# Patient Record
Sex: Female | Born: 1970 | Race: Black or African American | Hispanic: No | Marital: Single | Smoking: Former smoker
Health system: Southern US, Community
[De-identification: ages and names within clinical notes are randomized; demographics above are authoritative.]

## PROBLEM LIST (undated history)

## (undated) DIAGNOSIS — I10 Essential (primary) hypertension: Secondary | ICD-10-CM

## (undated) HISTORY — PX: HERNIA REPAIR: SHX51

---

## 1997-06-05 ENCOUNTER — Emergency Department (HOSPITAL_COMMUNITY): Admission: EM | Admit: 1997-06-05 | Discharge: 1997-06-05 | Payer: Self-pay | Admitting: Emergency Medicine

## 1998-06-16 ENCOUNTER — Emergency Department (HOSPITAL_COMMUNITY): Admission: EM | Admit: 1998-06-16 | Discharge: 1998-06-16 | Payer: Self-pay | Admitting: Emergency Medicine

## 2000-12-07 ENCOUNTER — Emergency Department (HOSPITAL_COMMUNITY): Admission: EM | Admit: 2000-12-07 | Discharge: 2000-12-07 | Payer: Self-pay | Admitting: Emergency Medicine

## 2002-10-30 ENCOUNTER — Emergency Department (HOSPITAL_COMMUNITY): Admission: EM | Admit: 2002-10-30 | Discharge: 2002-10-30 | Payer: Self-pay | Admitting: Emergency Medicine

## 2005-01-09 ENCOUNTER — Emergency Department (HOSPITAL_COMMUNITY): Admission: EM | Admit: 2005-01-09 | Discharge: 2005-01-09 | Payer: Self-pay | Admitting: Emergency Medicine

## 2005-03-01 ENCOUNTER — Emergency Department (HOSPITAL_COMMUNITY): Admission: EM | Admit: 2005-03-01 | Discharge: 2005-03-01 | Payer: Self-pay | Admitting: Emergency Medicine

## 2005-04-10 ENCOUNTER — Emergency Department (HOSPITAL_COMMUNITY): Admission: EM | Admit: 2005-04-10 | Discharge: 2005-04-10 | Payer: Self-pay | Admitting: Emergency Medicine

## 2006-01-10 ENCOUNTER — Ambulatory Visit: Payer: Self-pay | Admitting: Internal Medicine

## 2006-01-10 DIAGNOSIS — M1990 Unspecified osteoarthritis, unspecified site: Secondary | ICD-10-CM | POA: Insufficient documentation

## 2006-01-10 DIAGNOSIS — E785 Hyperlipidemia, unspecified: Secondary | ICD-10-CM | POA: Insufficient documentation

## 2006-01-10 LAB — CONVERTED CEMR LAB
Cholesterol: 211 mg/dL
HDL: 63 mg/dL
LDL Cholesterol: 131 mg/dL
Triglycerides: 84 mg/dL

## 2006-02-01 ENCOUNTER — Ambulatory Visit: Payer: Self-pay | Admitting: *Deleted

## 2006-02-06 ENCOUNTER — Emergency Department (HOSPITAL_COMMUNITY): Admission: EM | Admit: 2006-02-06 | Discharge: 2006-02-06 | Payer: Self-pay | Admitting: Emergency Medicine

## 2006-02-07 ENCOUNTER — Ambulatory Visit: Payer: Self-pay | Admitting: Internal Medicine

## 2006-02-07 DIAGNOSIS — K2900 Acute gastritis without bleeding: Secondary | ICD-10-CM | POA: Insufficient documentation

## 2006-04-10 ENCOUNTER — Ambulatory Visit: Payer: Self-pay | Admitting: Internal Medicine

## 2006-10-14 ENCOUNTER — Encounter (INDEPENDENT_AMBULATORY_CARE_PROVIDER_SITE_OTHER): Payer: Self-pay | Admitting: Internal Medicine

## 2006-11-22 ENCOUNTER — Encounter (INDEPENDENT_AMBULATORY_CARE_PROVIDER_SITE_OTHER): Payer: Self-pay | Admitting: *Deleted

## 2008-05-01 ENCOUNTER — Emergency Department (HOSPITAL_COMMUNITY): Admission: EM | Admit: 2008-05-01 | Discharge: 2008-05-01 | Payer: Self-pay | Admitting: Emergency Medicine

## 2009-03-21 ENCOUNTER — Emergency Department (HOSPITAL_COMMUNITY): Admission: EM | Admit: 2009-03-21 | Discharge: 2009-03-21 | Payer: Self-pay | Admitting: Emergency Medicine

## 2010-06-22 LAB — URINALYSIS, ROUTINE W REFLEX MICROSCOPIC
Bilirubin Urine: NEGATIVE
Glucose, UA: NEGATIVE mg/dL
Hgb urine dipstick: NEGATIVE
Ketones, ur: NEGATIVE mg/dL
Nitrite: NEGATIVE
Protein, ur: NEGATIVE mg/dL
Specific Gravity, Urine: 1.018 (ref 1.005–1.030)
Urobilinogen, UA: 0.2 mg/dL (ref 0.0–1.0)
pH: 6 (ref 5.0–8.0)

## 2010-06-22 LAB — POCT PREGNANCY, URINE: Preg Test, Ur: NEGATIVE

## 2013-01-14 ENCOUNTER — Ambulatory Visit (HOSPITAL_COMMUNITY)
Admission: RE | Admit: 2013-01-14 | Discharge: 2013-01-14 | Disposition: A | Discharge status: Home / Self Care | Payer: Self-pay | Source: Ambulatory Visit | Attending: Internal Medicine | Admitting: Internal Medicine

## 2013-01-14 ENCOUNTER — Other Ambulatory Visit (HOSPITAL_COMMUNITY): Payer: Self-pay | Admitting: Internal Medicine

## 2013-01-14 DIAGNOSIS — K921 Melena: Secondary | ICD-10-CM | POA: Insufficient documentation

## 2013-01-14 DIAGNOSIS — R109 Unspecified abdominal pain: Secondary | ICD-10-CM | POA: Insufficient documentation

## 2013-01-14 DIAGNOSIS — R52 Pain, unspecified: Secondary | ICD-10-CM

## 2013-12-30 ENCOUNTER — Emergency Department (HOSPITAL_COMMUNITY)
Admission: EM | Admit: 2013-12-30 | Discharge: 2013-12-30 | Disposition: A | Discharge status: Home / Self Care | Payer: No Typology Code available for payment source | Attending: Emergency Medicine | Admitting: Emergency Medicine

## 2013-12-30 ENCOUNTER — Emergency Department (HOSPITAL_COMMUNITY): Payer: No Typology Code available for payment source

## 2013-12-30 ENCOUNTER — Encounter (HOSPITAL_COMMUNITY): Payer: Self-pay | Admitting: Emergency Medicine

## 2013-12-30 DIAGNOSIS — Z79899 Other long term (current) drug therapy: Secondary | ICD-10-CM | POA: Insufficient documentation

## 2013-12-30 DIAGNOSIS — I10 Essential (primary) hypertension: Secondary | ICD-10-CM | POA: Insufficient documentation

## 2013-12-30 DIAGNOSIS — Z720 Tobacco use: Secondary | ICD-10-CM | POA: Insufficient documentation

## 2013-12-30 DIAGNOSIS — M19079 Primary osteoarthritis, unspecified ankle and foot: Secondary | ICD-10-CM

## 2013-12-30 DIAGNOSIS — T1490XA Injury, unspecified, initial encounter: Secondary | ICD-10-CM

## 2013-12-30 DIAGNOSIS — M1990 Unspecified osteoarthritis, unspecified site: Secondary | ICD-10-CM | POA: Insufficient documentation

## 2013-12-30 HISTORY — DX: Essential (primary) hypertension: I10

## 2013-12-30 MED ORDER — MELOXICAM 7.5 MG PO TABS: 7.5000 mg | ORAL_TABLET | Freq: Every day | ORAL | Status: DC

## 2013-12-30 NOTE — ED Provider Notes (Signed)
CSN: [id number]     Arrival date & time 12/30/13  1216 History  This chart was scribed for non-physician practitioner Trixie DredgeEmily Loris Seelye, PA-C working with Doug SouSam Jacubowitz, MD by Leone PayorSonum Patel, ED Scribe. This patient was seen in room TR04C/TR04C and the patient's care was started at 3:57 PM.    Chief Complaint  Patient presents with  . Toe Pain    The history is provided by the patient. No language interpreter was used.    HPI Comments: Patricia Zuniga is a 43 y.o. female who presents to the Emergency Department complaining of 1 year of daily left great toe pain that began after an injury. She states she struck the left great toe on a step and has had pain since then. She states the pain has worsened over the last 2 months. She describes the pain as aching and rates it as 9/10 currently. She has applied ice, received massages without relief. She states the pain is worse with ambulation. She denies weakness, numbness, tingling, fevers.    Past Medical History  Diagnosis Date  . Hypertension    Past Surgical History  Procedure Laterality Date  . Hernia repair     History reviewed. No pertinent family history. History  Substance Use Topics  . Smoking status: Current Every Day Smoker  . Smokeless tobacco: Not on file  . Alcohol Use: No   OB History   Grav Para Term Preterm Abortions TAB SAB Ect Mult Living                 Review of Systems  Musculoskeletal: Positive for arthralgias.  Skin: Negative for wound.  Neurological: Negative for weakness and numbness.      Allergies  Review of patient's allergies indicates no known allergies.  Home Medications   Prior to Admission medications   Medication Sig Start Date End Date Taking? Authorizing Provider  losartan (COZAAR) 25 MG tablet Take 25 mg by mouth daily.   Yes Historical Provider, MD   BP 163/81  Pulse 75  Temp(Src) 98.3 F (36.8 C) (Oral)  Resp 18  Ht 5\' 2"  (1.575 m)  Wt 160 lb (72.576 kg)  BMI 29.26 kg/m2  SpO2 99%   LMP 12/07/2013 Physical Exam  Nursing note and vitals reviewed. Constitutional: She appears well-developed and well-nourished. No distress.  HENT:  Head: Normocephalic and atraumatic.  Neck: Neck supple.  Pulmonary/Chest: Effort normal.  Musculoskeletal: Normal range of motion. She exhibits no edema and no tenderness.  Right foot nontender. No erythema, edema, warmth, discharge, or skin changes. Left foot normal.    Neurological: She is alert.  Skin: She is not diaphoretic.    ED Course  Procedures (including critical care time)  DIAGNOSTIC STUDIES: Oxygen Saturation is 99% on RA, normal by my interpretation.    COORDINATION OF CARE: 4:03 PM Discussed treatment plan with pt at bedside and pt agreed to plan.   Labs Review Labs Reviewed - No data to display  Imaging Review Dg Toe Great Right  12/30/2013   CLINICAL DATA:  Toe injury  EXAM: RIGHT GREAT TOE  COMPARISON:  03/01/2005  FINDINGS: Progressive degenerative change and spurring in the first metatarsophalangeal joint. Normal alignment. No fracture identified.  IMPRESSION: Progressive degenerative change in the first metatarsal phalangeal joint. Negative for fracture.   Electronically Signed   By: Marlan Palauharles  Clark M.D.   On: 12/30/2013 15:55     EKG Interpretation None      MDM   Final diagnoses:  Arthritis of  first MTP joint    Afebrile, nontoxic patient with right 1st MTP pain x 1 year.  Remote injury, kicking concrete stair.  No clinical e/o septic joint or gout.  Xray demonstrates arthritic changes.   D/C home with mobic, podiatry follow up as needed.   Discussed result, findings, treatment, and follow up  with patient.  Pt given return precautions.  Pt verbalizes understanding and agrees with plan.       I personally performed the services described in this documentation, which was scribed in my presence. The recorded information has been reviewed and is accurate.   Trixie Dredgemily Ryenne Lynam, PA-C 12/30/13 [phone]

## 2013-12-30 NOTE — ED Notes (Signed)
Declined W/C at D/C and was escorted to lobby by RN. 

## 2013-12-30 NOTE — Discharge Instructions (Signed)
Read the information below.  Use the prescribed medication as directed.  Please discuss all new medications with your pharmacist.  You may return to the Emergency Department at any time for worsening condition or any new symptoms that concern you.  If you develop uncontrolled pain, weakness or numbness of the extremity, severe discoloration of the skin, or you are unable to move your toe or walk, return to the ER for a recheck.      Arthritis, Nonspecific Arthritis is inflammation of a joint. This usually means pain, redness, warmth or swelling are present. One or more joints may be involved. There are a number of types of arthritis. Your caregiver may not be able to tell what type of arthritis you have right away. CAUSES  The most common cause of arthritis is the wear and tear on the joint (osteoarthritis). This causes damage to the cartilage, which can break down over time. The knees, hips, back and neck are most often affected by this type of arthritis. Other types of arthritis and common causes of joint pain include:  Sprains and other injuries near the joint. Sometimes minor sprains and injuries cause pain and swelling that develop hours later.  Rheumatoid arthritis. This affects hands, feet and knees. It usually affects both sides of your body at the same time. It is often associated with chronic ailments, fever, weight loss and general weakness.  Crystal arthritis. Gout and pseudo gout can cause occasional acute severe pain, redness and swelling in the foot, ankle, or knee.  Infectious arthritis. Bacteria can get into a joint through a break in overlying skin. This can cause infection of the joint. Bacteria and viruses can also spread through the blood and affect your joints.  Drug, infectious and allergy reactions. Sometimes joints can become mildly painful and slightly swollen with these types of illnesses. SYMPTOMS   Pain is the main symptom.  Your joint or joints can also be red,  swollen and warm or hot to the touch.  You may have a fever with certain types of arthritis, or even feel overall ill.  The joint with arthritis will hurt with movement. Stiffness is present with some types of arthritis. DIAGNOSIS  Your caregiver will suspect arthritis based on your description of your symptoms and on your exam. Testing may be needed to find the type of arthritis:  Blood and sometimes urine tests.  X-ray tests and sometimes CT or MRI scans.  Removal of fluid from the joint (arthrocentesis) is done to check for bacteria, crystals or other causes. Your caregiver (or a specialist) will numb the area over the joint with a local anesthetic, and use a needle to remove joint fluid for examination. This procedure is only minimally uncomfortable.  Even with these tests, your caregiver may not be able to tell what kind of arthritis you have. Consultation with a specialist (rheumatologist) may be helpful. TREATMENT  Your caregiver will discuss with you treatment specific to your type of arthritis. If the specific type cannot be determined, then the following general recommendations may apply. Treatment of severe joint pain includes:  Rest.  Elevation.  Anti-inflammatory medication (for example, ibuprofen) may be prescribed. Avoiding activities that cause increased pain.  Only take over-the-counter or prescription medicines for pain and discomfort as recommended by your caregiver.  Cold packs over an inflamed joint may be used for 10 to 15 minutes every hour. Hot packs sometimes feel better, but do not use overnight. Do not use hot packs if you are  diabetic without your caregiver's permission.  A cortisone shot into arthritic joints may help reduce pain and swelling.  Any acute arthritis that gets worse over the next 1 to 2 days needs to be looked at to be sure there is no joint infection. Long-term arthritis treatment involves modifying activities and lifestyle to reduce joint  stress jarring. This can include weight loss. Also, exercise is needed to nourish the joint cartilage and remove waste. This helps keep the muscles around the joint strong. HOME CARE INSTRUCTIONS   Do not take aspirin to relieve pain if gout is suspected. This elevates uric acid levels.  Only take over-the-counter or prescription medicines for pain, discomfort or fever as directed by your caregiver.  Rest the joint as much as possible.  If your joint is swollen, keep it elevated.  Use crutches if the painful joint is in your leg.  Drinking plenty of fluids may help for certain types of arthritis.  Follow your caregiver's dietary instructions.  Try low-impact exercise such as:  Swimming.  Water aerobics.  Biking.  Walking.  Morning stiffness is often relieved by a warm shower.  Put your joints through regular range-of-motion. SEEK MEDICAL CARE IF:   You do not feel better in 24 hours or are getting worse.  You have side effects to medications, or are not getting better with treatment. SEEK IMMEDIATE MEDICAL CARE IF:   You have a fever.  You develop severe joint pain, swelling or redness.  Many joints are involved and become painful and swollen.  There is severe back pain and/or leg weakness.  You have loss of bowel or bladder control. Document Released: 03/31/2004 Document Revised: 05/16/2011 Document Reviewed: 04/16/2008 [facility] Patient Information 2015 [place] [place]. This information is not intended to replace advice given to you by your health care provider. Make sure you discuss any questions you have with your health care provider.

## 2013-12-30 NOTE — ED Provider Notes (Signed)
Medical screening examination/treatment/procedure(s) were performed by non-physician practitioner and as supervising physician I was immediately available for consultation/collaboration.   EKG Interpretation None       Doug SouSam Joshoa Shawler, MD 12/30/13 1723

## 2013-12-30 NOTE — ED Notes (Signed)
Per pt sts she injured her right big toe about 1 year ago and has been having issues with it since.,

## 2014-10-30 ENCOUNTER — Encounter (HOSPITAL_COMMUNITY): Payer: Self-pay | Admitting: *Deleted

## 2014-10-30 ENCOUNTER — Emergency Department (INDEPENDENT_AMBULATORY_CARE_PROVIDER_SITE_OTHER)
Admission: EM | Admit: 2014-10-30 | Discharge: 2014-10-30 | Disposition: A | Discharge status: Home / Self Care | Payer: Self-pay | Source: Home / Self Care | Attending: Family Medicine | Admitting: Family Medicine

## 2014-10-30 DIAGNOSIS — K0889 Other specified disorders of teeth and supporting structures: Secondary | ICD-10-CM

## 2014-10-30 DIAGNOSIS — K088 Other specified disorders of teeth and supporting structures: Secondary | ICD-10-CM

## 2014-10-30 MED ORDER — CLINDAMYCIN HCL 300 MG PO CAPS: 300.0000 mg | ORAL_CAPSULE | Freq: Three times a day (TID) | ORAL | Status: DC

## 2014-10-30 MED ORDER — DICLOFENAC POTASSIUM 50 MG PO TABS: 50.0000 mg | ORAL_TABLET | Freq: Three times a day (TID) | ORAL | Status: DC

## 2014-10-30 NOTE — ED Notes (Signed)
Pt  Reports  l  Lower  Toothache      With  Some  Swelling       Pt    Reports  The  Symptoms  X  3  Days

## 2014-10-30 NOTE — Discharge Instructions (Signed)
Take medicine as prescribed, see your dentist as soon as possible °

## 2014-10-30 NOTE — ED Provider Notes (Signed)
CSN: [id number]     Arrival date & time 10/30/14  1620 History   First MD Initiated Contact with Patient 10/30/14 1636     Chief Complaint  Patient presents with  . Dental Pain   (Consider location/radiation/quality/duration/timing/severity/associated sxs/prior Treatment) Patient is a 44 y.o. female presenting with tooth pain. The history is provided by the patient.  Dental Pain Location:  Upper and lower Upper teeth location:  15/LU 2nd molar Lower teeth location:  17/LL 3rd molar Quality:  Throbbing Severity:  Moderate Onset quality:  Gradual Duration:  3 days Progression:  Unchanged Chronicity:  New Context: dental caries and poor dentition     Past Medical History  Diagnosis Date  . Hypertension    Past Surgical History  Procedure Laterality Date  . Hernia repair     History reviewed. No pertinent family history. Social History  Substance Use Topics  . Smoking status: Current Every Day Smoker  . Smokeless tobacco: None  . Alcohol Use: No   OB History    No data available     Review of Systems  Constitutional: Negative.   HENT: Positive for dental problem.     Allergies  Review of patient's allergies indicates no known allergies.  Home Medications   Prior to Admission medications   Medication Sig Start Date End Date Taking? Authorizing Provider  clindamycin (CLEOCIN) 300 MG capsule Take 1 capsule (300 mg total) by mouth 3 (three) times daily. 10/30/14   Linna Hoff, MD  diclofenac (CATAFLAM) 50 MG tablet Take 1 tablet (50 mg total) by mouth 3 (three) times daily. 10/30/14   Linna Hoff, MD  losartan (COZAAR) 25 MG tablet Take 25 mg by mouth daily.    Historical Provider, MD  meloxicam (MOBIC) 7.5 MG tablet Take 1 tablet (7.5 mg total) by mouth daily. 12/30/13   Trixie Dredge, PA-C   Meds Ordered and Administered this Visit  Medications - No data to display  BP 124/89 mmHg  Pulse 81  Temp(Src) 98.2 F (36.8 C) (Oral)  Resp 16  SpO2 98%  LMP  10/08/2014 No data found.   Physical Exam  Constitutional: She appears well-developed and well-nourished. No distress.  HENT:  Mouth/Throat: Uvula is midline and oropharynx is clear and moist. Abnormal dentition. Dental abscesses and dental caries present.    Nursing note and vitals reviewed.   ED Course  Procedures (including critical care time)  Labs Review Labs Reviewed - No data to display  Imaging Review No results found.   Visual Acuity Review  Right Eye Distance:   Left Eye Distance:   Bilateral Distance:    Right Eye Near:   Left Eye Near:    Bilateral Near:         MDM   1. Pain, dental        Linna Hoff, MD 10/30/14 [phone]

## 2014-11-05 ENCOUNTER — Emergency Department (INDEPENDENT_AMBULATORY_CARE_PROVIDER_SITE_OTHER)
Admission: EM | Admit: 2014-11-05 | Discharge: 2014-11-05 | Disposition: A | Discharge status: Home / Self Care | Payer: Self-pay | Source: Home / Self Care

## 2014-11-05 ENCOUNTER — Encounter (HOSPITAL_COMMUNITY): Payer: Self-pay | Admitting: Emergency Medicine

## 2014-11-05 DIAGNOSIS — B373 Candidiasis of vulva and vagina: Secondary | ICD-10-CM

## 2014-11-05 DIAGNOSIS — B3731 Acute candidiasis of vulva and vagina: Secondary | ICD-10-CM

## 2014-11-05 MED ORDER — FLUCONAZOLE 150 MG PO TABS: 150.0000 mg | ORAL_TABLET | Freq: Every day | ORAL | Status: DC

## 2014-11-05 NOTE — ED Provider Notes (Signed)
CSN: [id number]     Arrival date & time 11/05/14  1322 History   None    Chief Complaint  Patient presents with  . Vaginitis   (Consider location/radiation/quality/duration/timing/severity/associated sxs/prior Treatment) HPI  Patient seen on 07/30/2014 for dental infection. Started on clindamycin. Significantly improved since starting clindamycin. Swelling still present but never resolved. Patient states that over the last 2-3 days she has developed white thick vaginal discharge. No foul odor. Associated with vaginal itching. Denies any lower abdominal pain, back pain, dysuria, frequency, fevers, nausea, vomiting, constipation, diarrhea. Problem is intermittent and getting worse. She is not tried anything for her symptoms.  Past Medical History  Diagnosis Date  . Hypertension    Past Surgical History  Procedure Laterality Date  . Hernia repair     History reviewed. No pertinent family history. Social History  Substance Use Topics  . Smoking status: Current Every Day Smoker  . Smokeless tobacco: None  . Alcohol Use: No   OB History    No data available     Review of Systems Per HPI with all other pertinent systems negative.   Allergies  Review of patient's allergies indicates no known allergies.  Home Medications   Prior to Admission medications   Medication Sig Start Date End Date Taking? Authorizing Provider  clindamycin (CLEOCIN) 300 MG capsule Take 1 capsule (300 mg total) by mouth 3 (three) times daily. 10/30/14  Yes Linna Hoff, MD  diclofenac (CATAFLAM) 50 MG tablet Take 1 tablet (50 mg total) by mouth 3 (three) times daily. 10/30/14  Yes Linna Hoff, MD  losartan (COZAAR) 25 MG tablet Take 25 mg by mouth daily.   Yes Historical Provider, MD  meloxicam (MOBIC) 7.5 MG tablet Take 1 tablet (7.5 mg total) by mouth daily. 12/30/13  Yes Trixie Dredge, PA-C  fluconazole (DIFLUCAN) 150 MG tablet Take 1 tablet (150 mg total) by mouth daily. Repeat dose in 3 days 11/05/14    Ozella Rocks, MD   Meds Ordered and Administered this Visit  Medications - No data to display  BP 147/92 mmHg  Pulse 68  Temp(Src) 97.9 F (36.6 C) (Oral)  Resp 18  SpO2 95%  LMP 11/05/2014 (Exact Date) No data found.   Physical Exam Physical Exam  Constitutional: oriented to person, place, and time. appears well-developed and well-nourished. No distress.  HENT:  Head: Normocephalic and atraumatic.  Eyes: EOMI. PERRL.  Neck: Normal range of motion.  Cardiovascular: RRR, no m/r/g, 2+ distal pulses,  Pulmonary/Chest: Effort normal and breath sounds normal. No respiratory distress.  Abdominal: Soft. Bowel sounds are normal. NonTTP, no distension.  Musculoskeletal: Normal range of motion. Non ttp, no effusion.  Neurological: alert and oriented to person, place, and time.  Skin: Skin is warm. No rash noted. non diaphoretic.  Psychiatric: normal mood and affect. behavior is normal. Judgment and thought content normal.   ED Course  Procedures (including critical care time)  Labs Review Labs Reviewed - No data to display  Imaging Review No results found.   Visual Acuity Review  Right Eye Distance:   Left Eye Distance:   Bilateral Distance:    Right Eye Near:   Left Eye Near:    Bilateral Near:         MDM   1. Yeast vaginitis    Continue clindamycin for improving dental infection. Start Diflucan. Follow-up when necessary    Ozella Rocks, MD 11/05/14 [phone]

## 2014-11-05 NOTE — ED Notes (Signed)
Pt was prescribed Clindamycin last Thursday for a toothache.  Pt now reports yeast infection symptoms to include, discharge, burning and itching.

## 2014-11-05 NOTE — Discharge Instructions (Signed)
You have a yeast infection.  Please take the diflucan Please continue your antibiotic

## 2015-11-02 ENCOUNTER — Encounter (HOSPITAL_COMMUNITY): Payer: Self-pay | Admitting: Emergency Medicine

## 2015-11-02 ENCOUNTER — Emergency Department (HOSPITAL_COMMUNITY)
Admission: EM | Admit: 2015-11-02 | Discharge: 2015-11-02 | Disposition: A | Discharge status: Home / Self Care | Payer: Self-pay | Attending: Emergency Medicine | Admitting: Emergency Medicine

## 2015-11-02 DIAGNOSIS — I10 Essential (primary) hypertension: Secondary | ICD-10-CM | POA: Insufficient documentation

## 2015-11-02 DIAGNOSIS — Z87891 Personal history of nicotine dependence: Secondary | ICD-10-CM | POA: Insufficient documentation

## 2015-11-02 DIAGNOSIS — Z79899 Other long term (current) drug therapy: Secondary | ICD-10-CM | POA: Insufficient documentation

## 2015-11-02 LAB — RAPID URINE DRUG SCREEN, HOSP PERFORMED
Amphetamines: NOT DETECTED
Barbiturates: NOT DETECTED
Benzodiazepines: NOT DETECTED
Cocaine: NOT DETECTED
Opiates: NOT DETECTED
Tetrahydrocannabinol: POSITIVE — AB

## 2015-11-02 LAB — COMPREHENSIVE METABOLIC PANEL
ALT: 25 U/L (ref 14–54)
AST: 27 U/L (ref 15–41)
Albumin: 3.7 g/dL (ref 3.5–5.0)
Alkaline Phosphatase: 52 U/L (ref 38–126)
Anion gap: 4 — ABNORMAL LOW (ref 5–15)
BUN: 8 mg/dL (ref 6–20)
CALCIUM: 9.3 mg/dL (ref 8.9–10.3)
CO2: 29 mmol/L (ref 22–32)
Chloride: 105 mmol/L (ref 101–111)
Creatinine, Ser: 0.68 mg/dL (ref 0.44–1.00)
GFR calc Af Amer: >60 mL/min (ref >60)
GFR calc non Af Amer: >60 mL/min (ref >60)
Glucose, Bld: 108 mg/dL — ABNORMAL HIGH (ref 65–99)
Potassium: 4 mmol/L (ref 3.5–5.1)
Sodium: 138 mmol/L (ref 135–145)
Total Bilirubin: 0.5 mg/dL (ref 0.3–1.2)
Total Protein: 6.7 g/dL (ref 6.5–8.1)

## 2015-11-02 LAB — CBC WITH DIFFERENTIAL/PLATELET
BASOS PCT: 0 %
Basophils Absolute: 0 10*3/uL (ref 0.0–0.1)
Eosinophils Absolute: 0.1 10*3/uL (ref 0.0–0.7)
Eosinophils Relative: 1 %
HCT: 37 % (ref 36.0–46.0)
Hemoglobin: 12.2 g/dL (ref 12.0–15.0)
Lymphocytes Relative: 45 %
Lymphs Abs: 4.6 10*3/uL — ABNORMAL HIGH (ref 0.7–4.0)
MCH: 29.6 pg (ref 26.0–34.0)
MCHC: 33 g/dL (ref 30.0–36.0)
MCV: 89.8 fL (ref 78.0–100.0)
MONOS PCT: 6 %
Monocytes Absolute: 0.6 10*3/uL (ref 0.1–1.0)
Neutro Abs: 4.8 10*3/uL (ref 1.7–7.7)
Neutrophils Relative %: 48 %
Platelets: 384 10*3/uL (ref 150–400)
RBC: 4.12 MIL/uL (ref 3.87–5.11)
RDW: 15.1 % (ref 11.5–15.5)
WBC: 10.2 10*3/uL (ref 4.0–10.5)

## 2015-11-02 MED ORDER — CLONIDINE HCL 0.1 MG PO TABS: 0.1000 mg | ORAL_TABLET | Freq: Every day | ORAL | 0 refills | Status: DC

## 2015-11-02 NOTE — ED Notes (Signed)
Patient ambulated to bathroom without difficulty, placed back on heart monitor

## 2015-11-02 NOTE — ED Triage Notes (Signed)
Went to pharmacy -- had BP checked-- pharmacist told pt to come and get it checked. Recently was prescribed BP med (a new one) .

## 2015-11-02 NOTE — ED Provider Notes (Signed)
[facility]-EMERGENCY DEPT Provider Note   CSN: [id number] Arrival date & time: 11/02/15  1117     History   Chief Complaint Chief Complaint  Patient presents with  . Hypertension    HPI Patricia Zuniga is a 45 y.o. female.  HPI Patient comes in with high blood pressure.  Taking 2 different medications.  Went to the dentist today and they told her they could not do anything because her blood pressure was elevated.  She did smoke a cigarette earlier today.  Patient denies chest pain or headache.  Denies nausea vomiting. Past Medical History:  Diagnosis Date  . Hypertension     Patient Active Problem List   Diagnosis Date Noted  . GASTRITIS, ACUTE, WITHOUT HEMORRHAGE 02/07/2006  . HYPERCHOLESTEROLEMIA, BORDERLINE 01/10/2006  . DEGENERATIVE JOINT DISEASE 01/10/2006    Past Surgical History:  Procedure Laterality Date  . HERNIA REPAIR      OB History    No data available       Home Medications    Prior to Admission medications   Medication Sig Start Date End Date Taking? Authorizing Provider  hydrochlorothiazide (HYDRODIURIL) 25 MG tablet Take 25 mg by mouth daily.   Yes Historical Provider, MD  losartan (COZAAR) 50 MG tablet Take 50 mg by mouth daily.   Yes Historical Provider, MD  clindamycin (CLEOCIN) 300 MG capsule Take 1 capsule (300 mg total) by mouth 3 (three) times daily. Patient not taking: Reported on 11/02/2015 10/30/14   Linna Hoff, MD  cloNIDine (CATAPRES) 0.1 MG tablet Take 1 tablet (0.1 mg total) by mouth daily. 11/02/15   Nelva Nay, MD  diclofenac (CATAFLAM) 50 MG tablet Take 1 tablet (50 mg total) by mouth 3 (three) times daily. Patient not taking: Reported on 11/02/2015 10/30/14   Linna Hoff, MD  fluconazole (DIFLUCAN) 150 MG tablet Take 1 tablet (150 mg total) by mouth daily. Repeat dose in 3 days Patient not taking: Reported on 11/02/2015 11/05/14   Ozella Rocks, MD  meloxicam (MOBIC) 7.5 MG tablet Take 1 tablet (7.5 mg total) by mouth  daily. Patient not taking: Reported on 11/02/2015 12/30/13   Trixie Dredge, PA-C    Family History No family history on file.  Social History Social History  Substance Use Topics  . Smoking status: Current Every Day Smoker    Packs/day: 0.50  . Smokeless tobacco: Never Used  . Alcohol use No     Allergies   Review of patient's allergies indicates no known allergies.   Review of Systems Review of Systems  All other systems reviewed and are negative.    Physical Exam Updated Vital Signs BP 167/86   Pulse (!) 52   Temp 97.9 F (36.6 C) (Oral)   Resp 16   Ht 5\' 2"  (1.575 m)   Wt 170 lb (77.1 kg)   LMP 10/01/2015   SpO2 94%   BMI 31.09 kg/m   Physical Exam Physical Exam  Nursing note and vitals reviewed. Constitutional: She is oriented to person, place, and time. She appears well-developed and well-nourished. No distress.  HENT:  Head: Normocephalic and atraumatic.  Eyes: Pupils are equal, round, and reactive to light.  Neck: Normal range of motion.  Cardiovascular: Normal rate and intact distal pulses.   Pulmonary/Chest: No respiratory distress.  Abdominal: Normal appearance. She exhibits no distension.  Musculoskeletal: Normal range of motion.  Neurological: She is alert and oriented to person, place, and time. No cranial nerve deficit.  Skin: Skin is warm  and dry. No rash noted.  Psychiatric: She has a normal mood and affect. Her behavior is normal.    ED Treatments / Results  Labs (all labs ordered are listed, but only abnormal results are displayed) Labs Reviewed  CBC WITH DIFFERENTIAL/PLATELET - Abnormal; Notable for the following:       Result Value   Lymphs Abs 4.6 (*)    All other components within normal limits  COMPREHENSIVE METABOLIC PANEL - Abnormal; Notable for the following:    Glucose, Bld 108 (*)    Anion gap 4 (*)    All other components within normal limits  URINE RAPID DRUG SCREEN, HOSP PERFORMED - Abnormal; Notable for the following:     Tetrahydrocannabinol POSITIVE (*)    All other components within normal limits    EKG  EKG Interpretation None       Radiology No results found.  Procedures Procedures (including critical care time)  Medications Ordered in ED Medications - No data to display   Initial Impression / Assessment and Plan / ED Course  I have reviewed the triage vital signs and the nursing notes.  Pertinent labs & imaging results that were available during my care of the patient were reviewed by me and considered in my medical decision making (see chart for details).  Clinical Course  Patient on maximum dose of losartan.  Will add clonidine at a low dose of 0.1 mg daily and have her follow-up with her primary care physician since possible.    Final Clinical Impressions(s) / ED Diagnoses   Final diagnoses:  Essential hypertension    New Prescriptions New Prescriptions   CLONIDINE (CATAPRES) 0.1 MG TABLET    Take 1 tablet (0.1 mg total) by mouth daily.     Nelva Nayobert Marcheta Horsey, MD 11/02/15 1359

## 2018-05-31 MED FILL — SIMVASTATIN 20 MG TABLET: 20 | 30 days supply | Qty: 30 | Fill #0

## 2018-06-29 MED FILL — HYDROCHLOROTHIAZIDE 25 MG T: 25 | 30 days supply | Qty: 30 | Fill #0

## 2018-06-29 MED FILL — LOSARTAN POTASSIUM 50 MG TA: 50 | 30 days supply | Qty: 30 | Fill #0

## 2018-06-29 MED FILL — SIMVASTATIN 20 MG TABLET: 20 | 30 days supply | Qty: 30 | Fill #1

## 2018-08-06 MED FILL — LOSARTAN POTASSIUM 50 MG TA: 50 | 30 days supply | Qty: 30 | Fill #1

## 2018-08-06 MED FILL — HYDROCHLOROTHIAZIDE 25 MG T: 25 | 30 days supply | Qty: 30 | Fill #1

## 2018-08-06 MED FILL — SIMVASTATIN 20 MG TABLET: 20 | 30 days supply | Qty: 30 | Fill #2

## 2019-08-09 ENCOUNTER — Encounter (HOSPITAL_COMMUNITY): Payer: Self-pay | Admitting: Emergency Medicine

## 2019-08-09 ENCOUNTER — Ambulatory Visit (HOSPITAL_COMMUNITY)
Admission: EM | Admit: 2019-08-09 | Discharge: 2019-08-09 | Disposition: A | Discharge status: Home / Self Care | Payer: BLUE CROSS/BLUE SHIELD | Attending: Family Medicine | Admitting: Family Medicine

## 2019-08-09 ENCOUNTER — Other Ambulatory Visit: Payer: Self-pay

## 2019-08-09 DIAGNOSIS — M6530 Trigger finger, unspecified finger: Secondary | ICD-10-CM | POA: Diagnosis not present

## 2019-08-09 DIAGNOSIS — M7702 Medial epicondylitis, left elbow: Secondary | ICD-10-CM

## 2019-08-09 MED ORDER — METHYLPREDNISOLONE 4 MG PO TBPK: ORAL_TABLET | ORAL | 0 refills | Status: DC

## 2019-08-09 NOTE — Discharge Instructions (Signed)
Take the Medrol Dosepak as directed. Take all of day 1 today. This is the anti-inflammatory medicine to take down pain and swelling Rest your arm. Wear sling and splint to remind you to limit your activity. Use ice for 20 minutes every 2-4 hours Call hand specialist if not improving by Monday

## 2019-08-09 NOTE — ED Triage Notes (Signed)
Pt c/o chronic left hand pain, specifically left middle finger is unable to straighten or make a fist. She states she is left handed.

## 2019-08-09 NOTE — ED Provider Notes (Signed)
[place]    CSN: [id number] Arrival date & time: 08/09/19  1528      History   Chief Complaint Chief Complaint  Patient presents with  . Hand Pain    HPI Patricia Zuniga is a 49 y.o. female.   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  Some pain up in her shoulder. No fall or injury.  No change in activity.  She has not taken any medication for this pain. She is compliant with her blood pressure medication.  Past Medical History:  Diagnosis Date  . Hypertension     Patient Active Problem List   Diagnosis Date Noted  . GASTRITIS, ACUTE, WITHOUT HEMORRHAGE 02/07/2006  . HYPERCHOLESTEROLEMIA, BORDERLINE 01/10/2006  . DEGENERATIVE JOINT DISEASE 01/10/2006    Past Surgical History:  Procedure Laterality Date  . HERNIA REPAIR      OB History   No obstetric history on file.      Home Medications    Prior to Admission medications   Medication Sig Start Date End Date Taking? Authorizing Provider  cloNIDine (CATAPRES) 0.1 MG tablet Take 1 tablet (0.1 mg total) by mouth daily. 11/02/15  Yes Leonard Schwartz, MD  hydrochlorothiazide (HYDRODIURIL) 25 MG tablet Take 25 mg by mouth daily.   Yes [provider]  losartan (COZAAR) 50 MG tablet Take 50 mg by mouth daily.   Yes [provider]  methylPREDNISolone (MEDROL DOSEPAK) 4 MG TBPK tablet tad 08/09/19   Raylene Everts, MD    Family  History History reviewed. No pertinent family history.  Social History Social History   Tobacco Use  . Smoking status: Current Every Day Smoker    Packs/day: 0.50  . Smokeless tobacco: Never Used  Substance Use Topics  . Alcohol use: No  . Drug use: No     Allergies   Patient has no known allergies.   Review of Systems Review of Systems  Musculoskeletal: Positive for arthralgias.       Pain in left hand and arm     Physical Exam Triage Vital Signs ED Triage Vitals  Enc Vitals Group     BP 08/09/19 1636 (!) 161/88     Pulse Rate 08/09/19 1636 76     Resp 08/09/19 1636 18     Temp 08/09/19 1636 99.1 F (37.3 C)     Temp Source 08/09/19 1636 Oral     SpO2 08/09/19 1636 100 %     Weight --      Height --      Head Circumference --      Peak Flow --      Pain Score 08/09/19 1633 10     Pain Loc --      Pain Edu? --      Excl. in [place]? --    No data found.  Updated  Vital Signs BP (!) 161/88 (BP Location: Right Arm)   Pulse 76   Temp 99.1 F (37.3 C) (Oral)   Resp 18   LMP 07/26/2019   SpO2 100%   Visual Acuity Right Eye Distance:   Left Eye Distance:   Bilateral Distance:    Right Eye Near:   Left Eye Near:    Bilateral Near:     Physical Exam Constitutional:      General: She is not in acute distress.    Appearance: She is well-developed and normal weight.     Comments: Appears uncomfortable.  Resists movement of left arm  HENT:     Head: Normocephalic and atraumatic.     Mouth/Throat:     Comments: Mask is in place Eyes:     Conjunctiva/sclera: Conjunctivae normal.     Pupils: Pupils are equal, round, and reactive to light.  Cardiovascular:     Rate and Rhythm: Normal rate.  Pulmonary:     Effort: Pulmonary effort is normal. No respiratory distress.  Musculoskeletal:        General: Normal range of motion.     Cervical back: Normal range of motion.     Comments: Patient has slow but full range of motion of her neck.  Mild tenderness in  the left upper body of the trapezius.  No tenderness of the shoulder joint or movement of the shoulder.  There is tenderness of the lateral epicondyles and pain with wrist extension.  There is tenderness over the palmar aspect of the flexor tendon pulley, long finger, left hand.  There is clicking with range of motion that is painful for patient.  Sensory exam is normal  Skin:    General: Skin is warm and dry.  Neurological:     Mental Status: She is alert.  Psychiatric:        Mood and Affect: Mood normal.        Behavior: Behavior normal.      UC Treatments / Results  Labs (all labs ordered are listed, but only abnormal results are displayed) Labs Reviewed - No data to display  EKG   Radiology No results found.  Procedures Procedures (including critical care time)  Medications Ordered in UC Medications - No data to display  Initial Impression / Assessment and Plan / UC Course  I have reviewed the triage vital signs and the nursing notes.  Pertinent labs & imaging results that were available during my care of the patient were reviewed by me and considered in my medical decision making (see chart for details).     Patient has a trigger finger.  She also has some tendinitis of the forearm at the elbow.  Good range of motion of the elbow.  Some tenderness in the upper trapezius of the left.  Holds left arm is bothering her, likely due to modified body mechanics from change in the use of her arm and hand Final Clinical Impressions(s) / UC Diagnoses   Final diagnoses:  Trigger finger, acquired  Medial epicondylitis of elbow, left     Discharge Instructions     Take the Medrol Dosepak as directed. Take all of day 1 today. This is the anti-inflammatory medicine to take down pain and swelling Rest your arm. Wear sling and splint to remind you to limit your activity. Use ice for 20 minutes every 2-4 hours Call hand specialist if not improving by Monday    ED  Prescriptions    Medication Sig Dispense Auth.  Provider   methylPREDNISolone (MEDROL DOSEPAK) 4 MG TBPK tablet tad 21 tablet Eustace Moore, MD     PDMP not reviewed this encounter.   Eustace Moore, MD 08/09/19 1740

## 2019-11-05 ENCOUNTER — Emergency Department (HOSPITAL_COMMUNITY): Payer: BLUE CROSS/BLUE SHIELD

## 2019-11-05 ENCOUNTER — Encounter (HOSPITAL_COMMUNITY): Payer: Self-pay

## 2019-11-05 ENCOUNTER — Emergency Department (HOSPITAL_COMMUNITY)
Admission: EM | Admit: 2019-11-05 | Discharge: 2019-11-05 | Disposition: A | Discharge status: Home / Self Care | Payer: BLUE CROSS/BLUE SHIELD | Attending: Emergency Medicine | Admitting: Emergency Medicine

## 2019-11-05 ENCOUNTER — Other Ambulatory Visit: Payer: Self-pay

## 2019-11-05 DIAGNOSIS — Z87891 Personal history of nicotine dependence: Secondary | ICD-10-CM | POA: Insufficient documentation

## 2019-11-05 DIAGNOSIS — I10 Essential (primary) hypertension: Secondary | ICD-10-CM | POA: Insufficient documentation

## 2019-11-05 DIAGNOSIS — M79645 Pain in left finger(s): Secondary | ICD-10-CM | POA: Diagnosis present

## 2019-11-05 DIAGNOSIS — Z79899 Other long term (current) drug therapy: Secondary | ICD-10-CM | POA: Insufficient documentation

## 2019-11-05 NOTE — Discharge Instructions (Addendum)
I want you to follow-up with hand, call Dr. Amanda Pea when you get out of here to make an appointment. I want you to take Aleve once in the morning and once at night for the next 10 days as we discussed. If you have any new or worsening concerning symptoms please come back to the emergency department. Also until you know that your blood pressure was elevated today, speak to your primary care about this. Use the list below to find a primary care doctor, you can also go to [facility] health community health and wellness.

## 2019-11-05 NOTE — ED Triage Notes (Signed)
Patient c/o left middle finger pain that radiates into her left arm x 2 weeks. Patient states increased pain with movement and is now having decreased movement in her left middle finger.

## 2019-11-05 NOTE — ED Provider Notes (Signed)
[facility]-EMERGENCY DEPT Provider Note   CSN: [id number] Arrival date & time: 11/05/19  1318     History Chief Complaint  Patient presents with  . Hand Pain    Patricia Zuniga is a 49 y.o. female with pertinent past medical history of hypertension that presents the emergency department today for left middle finger pain for the past 2 weeks.  Denies any trauma to the area.  States that she is also having pain in her shoulder, for a long time  No known injury that she is aware of.  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  States that the most thing that is bothering her is her middle finger, worse when she moves it.  Has not tried anything for this.  Denies any fevers, chills.  Admits to some subjective paresthesias in her middle finger at times.  Denies any chest pain, shortness of breath.  No cardiac history. No other complaints. No SOB.   HPI     Past Medical History:  Diagnosis Date  . Hypertension     Patient Active Problem List   Diagnosis Date Noted  . GASTRITIS, ACUTE, WITHOUT HEMORRHAGE 02/07/2006  . HYPERCHOLESTEROLEMIA, BORDERLINE 01/10/2006  . DEGENERATIVE JOINT DISEASE 01/10/2006    Past Surgical History:  Procedure Laterality Date  . HERNIA REPAIR       OB History   No obstetric history on file.     Family History  Problem Relation Age of Onset  . Heart failure Mother   . Hypertension Father     Social History   Tobacco Use  . Smoking status: Former Smoker    Packs/day: 0.50  . Smokeless tobacco: Never Used  Vaping Use  . Vaping Use: Never used  Substance Use Topics  . Alcohol use: No  . Drug use: No    Home Medications Prior to Admission medications   Medication Sig Start Date End Date Taking? Authorizing Provider  cloNIDine (CATAPRES) 0.1 MG tablet Take 1 tablet (0.1 mg total) by mouth daily. 11/02/15   Nelva Nay, MD    hydrochlorothiazide (HYDRODIURIL) 25 MG tablet Take 25 mg by mouth daily.    [provider]  losartan (COZAAR) 50 MG tablet Take 50 mg by mouth daily.    [provider]  methylPREDNISolone (MEDROL DOSEPAK) 4 MG TBPK tablet tad 08/09/19   Eustace Moore, MD    Allergies    Patient has no known allergies.  Review of Systems   Review of Systems  Constitutional: Negative for diaphoresis, fatigue and fever.  Eyes: Negative for visual disturbance.  Respiratory: Negative for shortness of breath.   Cardiovascular: Negative for chest pain.  Gastrointestinal: Negative for nausea and vomiting.  Musculoskeletal: Positive for arthralgias. Negative for back pain and myalgias.  Skin: Negative for color change, pallor, rash and wound.  Neurological: Negative for syncope, weakness, light-headedness, numbness and headaches.  Psychiatric/Behavioral: Negative for behavioral problems and confusion.    Physical Exam Updated Vital Signs BP (!) 156/94 (BP Location: Right Arm)   Pulse 92   Temp 98.9 F (37.2 C) (Oral)   Resp 18   Ht 5\' 2"  (1.575 m)   Wt 79.4 kg   LMP 09/07/2019   SpO2 96%   BMI 32.01 kg/m   Physical Exam Constitutional:      General: She is not in acute distress.    Appearance: Normal appearance. She  is not ill-appearing, toxic-appearing or diaphoretic.  HENT:     Head: Normocephalic and atraumatic.  Eyes:     Extraocular Movements: Extraocular movements intact.     Pupils: Pupils are equal, round, and reactive to light.  Cardiovascular:     Rate and Rhythm: Normal rate and regular rhythm.     Pulses: Normal pulses.  Pulmonary:     Effort: Pulmonary effort is normal.     Breath sounds: Normal breath sounds.  Musculoskeletal:        General: Normal range of motion.     Cervical back: Normal range of motion and neck supple. No rigidity or tenderness.     Comments: Middle finer pain.  Painful movements upon flexion.  However patient is able to fully  flex and extend finger also able to abduct and adduct finger.  Is able to make an ok sign.  Normal range of motion to all joints on middle finger.  Normal sensation to sharp and dull sensation of middle finger and throughout hand.  Normal cap refill.  Radial pulse 2+ and equal.    Nontender throughout rest of arm.  Slightly tender around shoulder.  Normal range of motion to the shoulder.  Normal strength.  Normal sensation.  Right arm normal  Skin:    General: Skin is warm and dry.     Capillary Refill: Capillary refill takes less than 2 seconds.  Neurological:     General: No focal deficit present.     Mental Status: She is alert and oriented to person, place, and time.     Cranial Nerves: No cranial nerve deficit.     Sensory: No sensory deficit.     Motor: No weakness.  Psychiatric:        Mood and Affect: Mood normal.        Behavior: Behavior normal.        Thought Content: Thought content normal.     ED Results / Procedures / Treatments   Labs (all labs ordered are listed, but only abnormal results are displayed) Labs Reviewed - No data to display  EKG None  Radiology DG Shoulder Left  Result Date: 11/05/2019 CLINICAL DATA:  Shoulder pain EXAM: LEFT SHOULDER - 2+ VIEW COMPARISON:  None. FINDINGS: There is no evidence of fracture or dislocation. Mild irregularity along the under surface of the humeral head is likely degenerative in nature. Soft tissues are unremarkable. IMPRESSION: Mild degenerative changes without acute findings. Electronically Signed   By: Romona Curls M.D.   On: 11/05/2019 14:42   DG Finger Middle Left  Result Date: 11/05/2019 CLINICAL DATA:  Middle finger pain for 2 weeks. EXAM: LEFT MIDDLE FINGER 2+V COMPARISON:  None. FINDINGS: There is no evidence of fracture or dislocation. There is no evidence of arthropathy or other focal bone abnormality. Soft tissues are unremarkable. IMPRESSION: Negative. Electronically Signed   By: Romona Curls M.D.   On:  11/05/2019 14:39    Procedures Procedures (including critical care time)  Medications Ordered in ED Medications - No data to display  ED Course  I have reviewed the triage vital signs and the nursing notes.  Pertinent labs & imaging results that were available during my care of the patient were reviewed by me and considered in my medical decision making (see chart for details).    MDM Rules/Calculators/A&P                         Tashara V  Bonifield is a 49 y.o. female with pertinent past medical history of hypertension that presents the emergency department today for left middle finger pain for the past 2 weeks. Do not think that patient is having atypical ACS, has been going on for 2 weeks and patient has most likely overuse injury.  Finger x-ray and shoulder x-ray negative.  Think that patient most likely has flexor tendon injury, physical exam benign.  Patient has normal sensation to middle finger and throughout hand and arm.  Is able to range shoulder and elbow and fingers in all directions. Normal sensation.  Is painful to move finger.  Patient follow-up with hand and take Aleve.  Symptomatic treatment discussed with patient.  Strict return precautions given.  Patient to be discharged. Doubt need for further emergent work up at this time. I explained the diagnosis and have given explicit precautions to return to the ER including for any other new or worsening symptoms. The patient understands and accepts the medical plan as it's been dictated and I have answered their questions. Discharge instructions concerning home care and prescriptions have been given. The patient is STABLE and is discharged to home in good condition.  I discussed this case with my attending physician who cosigned this note including patient's presenting symptoms, physical exam, and planned diagnostics and interventions. Attending physician stated agreement with plan or made changes to plan which were implemented.       Final Clinical Impression(s) / ED Diagnoses Final diagnoses:  Finger pain, left    Rx / DC Orders ED Discharge Orders    None       Farrel Gordon, PA-C 11/05/19 1515    Lorre Nick, MD 11/06/19 1217

## 2019-12-06 ENCOUNTER — Other Ambulatory Visit (HOSPITAL_COMMUNITY): Payer: Self-pay | Admitting: Family Medicine

## 2020-02-18 ENCOUNTER — Encounter: Payer: Self-pay | Admitting: Family Medicine

## 2020-02-18 ENCOUNTER — Other Ambulatory Visit: Payer: Self-pay

## 2020-02-18 ENCOUNTER — Ambulatory Visit (INDEPENDENT_AMBULATORY_CARE_PROVIDER_SITE_OTHER): Payer: BLUE CROSS/BLUE SHIELD | Admitting: Family Medicine

## 2020-02-18 VITALS — BP 126/70 | HR 78 | Ht 62.0 in | Wt 178.2 lb

## 2020-02-18 DIAGNOSIS — Z13220 Encounter for screening for lipoid disorders: Secondary | ICD-10-CM

## 2020-02-18 DIAGNOSIS — M79645 Pain in left finger(s): Secondary | ICD-10-CM

## 2020-02-18 DIAGNOSIS — Z131 Encounter for screening for diabetes mellitus: Secondary | ICD-10-CM

## 2020-02-18 DIAGNOSIS — Z716 Tobacco abuse counseling: Secondary | ICD-10-CM

## 2020-02-18 DIAGNOSIS — Z23 Encounter for immunization: Secondary | ICD-10-CM | POA: Diagnosis not present

## 2020-02-18 DIAGNOSIS — Z1211 Encounter for screening for malignant neoplasm of colon: Secondary | ICD-10-CM | POA: Diagnosis not present

## 2020-02-18 LAB — POCT GLYCOSYLATED HEMOGLOBIN (HGB A1C): Hemoglobin A1C: 5.7 % — AB (ref 4.0–5.6)

## 2020-02-18 NOTE — Patient Instructions (Signed)
Thank you for coming to see me today. It was a pleasure. We spoke about stopping smoking this will reduce the risk of heart disease and stroke. You can follow up with me in a few weeks.  We will get some labs today.  If they are abnormal or we need to do something about them, I will call you.  If they are normal, I will send you a message on MyChart (if it is active) or a letter in the mail.  If you don't hear from [place] in 2 weeks, please call the office at the number below.  You may have tenditinitis on your finger. Please try tylenol and ibuprofen at home. Rest the finger and use compression.  If you have any questions or concerns, please do not hesitate to call the office at [phone].  If you develop fevers>100.5, shortness of breath, chest pain, palpitations, dizziness, abdominal pain, nausea, vomiting, diarrhea or cannot eat or drink then please go to the ER immediately.  Best wishes,   Dr Allena Katz

## 2020-02-18 NOTE — Progress Notes (Addendum)
° ° ° °  SUBJECTIVE:   CHIEF COMPLAINT / HPI:   Patricia Zuniga is a 49 y.o. 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 3rd finger pain started 1 month ago. Denies clicking, catching sensation or locking of finger. Denies Used a compression gloves which helps a little. Has not taken analgesia for it. She was seen in urgent care and was told she has tendinitis.    PMH HTN, hemorrhoids  PSH Hernia repair 2014, foot surgery 2017  [facility]  NKDA HCTZ, Losartan  FH Mom died 2-heart attack age 86 Dad-died 01/30/1998- stroke age 68  Sister-died 31-Oct-2022, Aneurysm  SH  Smokes half a pack a day, smoking for 2.5 years. Stopped for a year previously restarted because of stress. Works out in [employer] regularly and is trying to keep fit. Denies illicit drug use or ETOH use. Works in Surveyor, mining at rehab facility. Lives with boyfriend and feels safe with him.   HM Covid vaccine-up to date  Flu vaccine-up to date Pap smear-due Mammogram-due Colonoscopy-due, patient is happy for me to place referral for screening colonoscopy.  OBJECTIVE:   BP 126/70    Pulse 78    Ht 5\' 2"  (1.575 m)    Wt 178 lb 4 oz (80.9 kg)    LMP 01/09/2020    SpO2 98%    BMI 32.60 kg/m    General: Alert, no acute distress, pleasant Cardio: Normal S1 and S2, RRR, no r/m/g Pulm: CTAB, normal work of breathing Abdomen: Bowel sounds normal. Abdomen soft and non-tender.  MSK: no obvious erythema or skin changes over palmar or dorsal aspect of hand or digits. No obvious flexion deformities. No obvious palpable tendon nodes. Tenderness on palpation of left 3rd digit. Tenderness on flexion and extension of left 3rd digit. Normal flexion and extension of all digits bilaterally. Normal sensation.  Extremities: No peripheral edema.  Neuro: Cranial nerves grossly intact   ASSESSMENT/PLAN:   Screening for cholesterol  level Obtained lipid panel today.  Encounter for screening colonoscopy Placed referral to GI for colonoscopy.   Encounter for smoking cessation counseling Educated and discussed smoking cessation to reduced CVD and stroke risk especially given that patient's parents died very young of these conditions. Patient was previously successful with smoking cessation and was able to go "cold 13/06/2019" and gave up for 1 year. Offered nicotine patch/ referral to smoking cessation clinic. Pt declined and would rather try quitting herself. Will follow up at next visit.  Finger pain, left Likely tendinitis/repetitive strain from occupation. Recommended tylenol, ibuprofen, ice and compression. Also considered trigger finger given site of pain but unlikely as no nodes palpated and lacks typical exam findings such as locking, flexion deformities etc. Follow up if persistent symptoms.   Diabetes mellitus screening A1c 5.7 showing prediabetes. Will inform patient of results.     [place], MD PGY-2 [facility]

## 2020-02-19 DIAGNOSIS — M79645 Pain in left finger(s): Secondary | ICD-10-CM | POA: Insufficient documentation

## 2020-02-19 DIAGNOSIS — Z1211 Encounter for screening for malignant neoplasm of colon: Secondary | ICD-10-CM | POA: Insufficient documentation

## 2020-02-19 DIAGNOSIS — Z13220 Encounter for screening for lipoid disorders: Secondary | ICD-10-CM | POA: Insufficient documentation

## 2020-02-19 DIAGNOSIS — Z131 Encounter for screening for diabetes mellitus: Secondary | ICD-10-CM | POA: Insufficient documentation

## 2020-02-19 DIAGNOSIS — Z716 Tobacco abuse counseling: Secondary | ICD-10-CM | POA: Insufficient documentation

## 2020-02-19 LAB — BASIC METABOLIC PANEL
BUN/Creatinine Ratio: 17 (ref 9–23)
BUN: 11 mg/dL (ref 6–24)
CO2: 25 mmol/L (ref 20–29)
Calcium: 9.7 mg/dL (ref 8.7–10.2)
Chloride: 98 mmol/L (ref 96–106)
Creatinine, Ser: 0.65 mg/dL (ref 0.57–1.00)
GFR calc Af Amer: 119 mL/min/{1.73_m2} (ref >59)
GFR calc non Af Amer: 103 mL/min/{1.73_m2} (ref >59)
Glucose: 83 mg/dL (ref 65–99)
Potassium: 3.6 mmol/L (ref 3.5–5.2)
Sodium: 136 mmol/L (ref 134–144)

## 2020-02-19 LAB — LIPID PANEL
Chol/HDL Ratio: 4.5 ratio — ABNORMAL HIGH (ref 0.0–4.4)
Cholesterol, Total: 257 mg/dL — ABNORMAL HIGH (ref 100–199)
HDL: 57 mg/dL (ref >39)
LDL Chol Calc (NIH): 176 mg/dL — ABNORMAL HIGH (ref 0–99)
Triglycerides: 132 mg/dL (ref 0–149)
VLDL Cholesterol Cal: 24 mg/dL (ref 5–40)

## 2020-02-19 NOTE — Assessment & Plan Note (Signed)
Likely tendinitis/repetitive strain from occupation. Recommended tylenol, ibuprofen, ice and compression. Also considered trigger finger given site of pain but unlikely as no nodes palpated and lacks typical exam findings such as locking, flexion deformities etc. Follow up if persistent symptoms.

## 2020-02-19 NOTE — Assessment & Plan Note (Addendum)
Obtained lipid panel today. 

## 2020-02-19 NOTE — Assessment & Plan Note (Addendum)
A1c 5.7 showing prediabetes. Will inform patient of results.

## 2020-02-19 NOTE — Assessment & Plan Note (Signed)
Educated and discussed smoking cessation to reduced CVD and stroke risk especially given that patient's parents died very young of these conditions. Patient was previously successful with smoking cessation and was able to go "cold [place]" and gave up for 1 year. Offered nicotine patch/ referral to smoking cessation clinic. Pt declined and would rather try quitting herself. Will follow up at next visit.

## 2020-02-19 NOTE — Assessment & Plan Note (Signed)
Placed referral to GI for colonoscopy.

## 2020-02-26 ENCOUNTER — Telehealth: Payer: Self-pay | Admitting: Family Medicine

## 2020-02-26 ENCOUNTER — Other Ambulatory Visit: Payer: Self-pay | Admitting: Family Medicine

## 2020-02-26 MED ORDER — SIMVASTATIN 40 MG PO TABS: 40.0000 mg | ORAL_TABLET | Freq: Every day | ORAL | 3 refills | Status: DC

## 2020-02-26 NOTE — Telephone Encounter (Signed)
Called patient to inform her of her lab results showing that she is prediabetic and that her cholesterol levels are elevated. ASCVD risk 23.6%. Patient has previously taken a simvastatin before and would like to take this again. Prescribed Simvastatin 40mg  and sent to pharmacy. Pt will follow up in 3 months for repeat A1c and lipid panel.  MD  PGY-2, Family Medicine

## 2020-03-03 ENCOUNTER — Other Ambulatory Visit: Payer: Self-pay | Admitting: Family Medicine

## 2020-06-29 ENCOUNTER — Other Ambulatory Visit (HOSPITAL_COMMUNITY): Payer: Self-pay

## 2020-06-29 MED FILL — Losartan Potassium Tab 50 MG: ORAL | 90 days supply | Qty: 90 | Fill #0 | Status: AC

## 2020-06-29 MED FILL — Hydrochlorothiazide Tab 25 MG: ORAL | 90 days supply | Qty: 90 | Fill #0 | Status: AC

## 2020-11-27 ENCOUNTER — Other Ambulatory Visit (HOSPITAL_COMMUNITY): Payer: Self-pay

## 2020-11-27 MED FILL — Hydrochlorothiazide Tab 25 MG: ORAL | 90 days supply | Qty: 90 | Fill #1 | Status: CN

## 2020-11-27 MED FILL — Losartan Potassium Tab 50 MG: ORAL | 90 days supply | Qty: 90 | Fill #1 | Status: CN

## 2020-12-05 ENCOUNTER — Other Ambulatory Visit (HOSPITAL_COMMUNITY): Payer: Self-pay

## 2020-12-28 ENCOUNTER — Other Ambulatory Visit (HOSPITAL_COMMUNITY): Payer: Self-pay

## 2020-12-28 MED FILL — Losartan Potassium Tab 50 MG: ORAL | 90 days supply | Qty: 90 | Fill #1 | Status: AC

## 2020-12-28 MED FILL — Hydrochlorothiazide Tab 25 MG: ORAL | 90 days supply | Qty: 90 | Fill #1 | Status: AC

## 2020-12-31 ENCOUNTER — Other Ambulatory Visit (HOSPITAL_COMMUNITY): Payer: Self-pay

## 2021-04-13 NOTE — Progress Notes (Signed)
° ° ° °  SUBJECTIVE:   CHIEF COMPLAINT / HPI:   Patricia Zuniga is a 51 y.o. female presents for HTN  Hypertension Patient's current antihypertensive medications include: HCTZ 25mg  and Cozaar 50mg . Compliant with medications and tolerating well without side effects.  Checking BP at work with readings [id number] systolic. Unable to remember diastolic readings.  Denies any SOB, CP, vision changes, LE edema, medication SEs, or symptoms of hypotension.   Most recent creatinine trend:  Lab Results  Component Value Date   CREATININE 0.71 04/14/2021   CREATININE 0.65 02/18/2020   CREATININE 0.68 11/02/2015     Patient has had a BMP in the past 1 year.  [place] Office Visit from 02/18/2020 in [place]  PHQ-9 Total Score 3       Right knee pain Pt reports right sided knee pain and swelling for 2 weeks. No falls/trauma to the area. It felt it was "stuck". Denies erythema, warmness to touch or fevers. 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 She walks for an hour a day.   PERTINENT  PMH / PSH: HTN, HLD   OBJECTIVE:   BP 130/78    Pulse 76    Ht 5\' 2"  (1.575 m)    Wt 185 lb 9.6 oz (84.2 kg)    LMP 04/06/2021    SpO2 98%    BMI 33.95 kg/m    General: Alert, no acute distress Cardio: Normal S1 and S2, RRR, no r/m/g Pulm: CTAB, normal work of breathing Abdomen: Bowel sounds normal. Abdomen soft and non-tender.  Extremities: No peripheral edema.  Neuro: Cranial nerves grossly intact   Right knee: - Inspection: no gross deformity. No swelling/effusion, erythema or bruising. Skin intact - Palpation: mild TTP anterior knee joint - ROM: full active ROM with flexion and extension in knee and hip - Strength: 5/5 strength - Neuro/vasc: NV intact - Special Tests: - LIGAMENTS: negative anterior and posterior drawer, negative Lachman's, no MCL or LCL laxity  -- MENISCUS: negative McMurray's, negative Thessaly   Minor tenderness over right knee joint    ASSESSMENT/PLAN:   HTN (hypertension) BP at goal today. Continue Cozaar and HCTZ. Refilled meds. Obtained BMP today.  Prediabetes A1c 5.6 today. Follow up in 3-6 months.  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 Mildly swollen on exam today. No concerns for septic arthritis given lack of fever, erythema, warm joint and restricted ROM. Pt also has some right hip pain. Ordered right knee and hip x-rays. Recommended conservative management for the pain ie tylenol, diclofenac gel etc. Also recommended pt reduces number of minutes walking as this may be worsening the pain. Follow up with me for results.   Screening for cholesterol level Normal cholesterol results from lipid panel. Continue simvastatin 40mg .    Lattie Haw, MD PGY-3 [place]

## 2021-04-14 ENCOUNTER — Other Ambulatory Visit: Payer: Self-pay

## 2021-04-14 ENCOUNTER — Encounter: Payer: Self-pay | Admitting: Family Medicine

## 2021-04-14 ENCOUNTER — Ambulatory Visit (INDEPENDENT_AMBULATORY_CARE_PROVIDER_SITE_OTHER): Payer: BLUE CROSS/BLUE SHIELD | Admitting: Family Medicine

## 2021-04-14 VITALS — BP 130/78 | HR 76 | Ht 62.0 in | Wt 185.6 lb

## 2021-04-14 DIAGNOSIS — I159 Secondary hypertension, unspecified: Secondary | ICD-10-CM

## 2021-04-14 DIAGNOSIS — I10 Essential (primary) hypertension: Secondary | ICD-10-CM

## 2021-04-14 DIAGNOSIS — M25561 Pain in right knee: Secondary | ICD-10-CM

## 2021-04-14 DIAGNOSIS — Z13220 Encounter for screening for lipoid disorders: Secondary | ICD-10-CM

## 2021-04-14 DIAGNOSIS — R7303 Prediabetes: Secondary | ICD-10-CM

## 2021-04-14 DIAGNOSIS — M25551 Pain in right hip: Secondary | ICD-10-CM

## 2021-04-14 DIAGNOSIS — Z131 Encounter for screening for diabetes mellitus: Secondary | ICD-10-CM

## 2021-04-14 LAB — POCT GLYCOSYLATED HEMOGLOBIN (HGB A1C): Hemoglobin A1C: 5.6 % (ref 4.0–5.6)

## 2021-04-14 MED ORDER — DICLOFENAC SODIUM 1 % EX GEL
4.0000 g | CUTANEOUS | 0 refills | Status: AC | PRN
Start: 2021-04-14 — End: 2021-05-14
  Filled 2021-04-14: qty 350, 30d supply, fill #0

## 2021-04-14 MED ORDER — HYDROCHLOROTHIAZIDE 25 MG PO TABS
ORAL_TABLET | Freq: Every day | ORAL | 2 refills | Status: DC
  Filled 2021-04-14: qty 90, 90d supply, fill #0
  Filled 2021-09-28: qty 90, 90d supply, fill #1

## 2021-04-14 MED ORDER — LOSARTAN POTASSIUM 50 MG PO TABS
ORAL_TABLET | Freq: Every day | ORAL | 2 refills | Status: DC
  Filled 2021-04-14: qty 90, 90d supply, fill #0
  Filled 2021-09-28: qty 90, 90d supply, fill #1

## 2021-04-14 NOTE — Patient Instructions (Signed)
Thank you for coming to see me today. It was a pleasure. Today we discussed your right hip and knee pain. It could be arthritis. It is unlikely to be an infection. I recommend tylenol, motrin, diclofenac gel and ice to the area. Compression helps too.   I have placed an order for right hip and right knee xray.  Please go to [facility] or at [facility] to have this completed.  You do not need an appointment, but if you would like to call them beforehand, their number is [phone].  We will contact you with your results afterwards.   Please follow-up with me in 2-3 weeks   If you have any questions or concerns, please do not hesitate to call the office at [phone].  Best wishes,   Dr Allena Katz

## 2021-04-15 ENCOUNTER — Encounter: Payer: Self-pay | Admitting: Family Medicine

## 2021-04-15 LAB — BASIC METABOLIC PANEL
BUN/Creatinine Ratio: 13 (ref 9–23)
BUN: 9 mg/dL (ref 6–24)
CO2: 24 mmol/L (ref 20–29)
Calcium: 9.7 mg/dL (ref 8.7–10.2)
Chloride: 105 mmol/L (ref 96–106)
Creatinine, Ser: 0.71 mg/dL (ref 0.57–1.00)
Glucose: 94 mg/dL (ref 70–99)
Potassium: 4.2 mmol/L (ref 3.5–5.2)
Sodium: 144 mmol/L (ref 134–144)
eGFR: 102 mL/min/{1.73_m2} (ref >59)

## 2021-04-15 LAB — LIPID PANEL
Chol/HDL Ratio: 2.4 ratio (ref 0.0–4.4)
Cholesterol, Total: 157 mg/dL (ref 100–199)
HDL: 65 mg/dL (ref >39)
LDL Chol Calc (NIH): 78 mg/dL (ref 0–99)
Triglycerides: 75 mg/dL (ref 0–149)
VLDL Cholesterol Cal: 14 mg/dL (ref 5–40)

## 2021-04-16 DIAGNOSIS — M25561 Pain in right knee: Secondary | ICD-10-CM | POA: Insufficient documentation

## 2021-04-16 DIAGNOSIS — I10 Essential (primary) hypertension: Secondary | ICD-10-CM | POA: Insufficient documentation

## 2021-04-16 DIAGNOSIS — R7303 Prediabetes: Secondary | ICD-10-CM | POA: Insufficient documentation

## 2021-04-16 NOTE — Assessment & Plan Note (Signed)
BP at goal today. Continue Cozaar and HCTZ. Refilled meds. Obtained BMP today.

## 2021-04-16 NOTE — Assessment & Plan Note (Signed)
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 Mildly swollen on exam today. No concerns for septic arthritis given lack of fever, erythema, warm joint and restricted ROM. Pt also has some right hip pain. Ordered right knee and hip x-rays. Recommended conservative management for the pain ie tylenol, diclofenac gel etc. Also recommended pt reduces number of minutes walking as this may be worsening the pain. Follow up with me for results.

## 2021-04-16 NOTE — Assessment & Plan Note (Signed)
A1c 5.6 today. Follow up in 3-6 months.

## 2021-04-16 NOTE — Assessment & Plan Note (Addendum)
Normal cholesterol results from lipid panel. Continue simvastatin 40mg .

## 2021-06-03 ENCOUNTER — Telehealth: Payer: Self-pay

## 2021-06-03 NOTE — Telephone Encounter (Signed)
Patient contacted to schedule mammogram.  ? ?RE: Mobile Mammo event located at: ? ?[facility]  ?      ?[address]    ?[place] [address]    ? ?Date: April 7th  ? ? ?LVM for pt to call back as soon as possible.  ? ?

## 2021-06-10 NOTE — Telephone Encounter (Signed)
Final attempt to contact ? ?

## 2021-09-28 ENCOUNTER — Other Ambulatory Visit: Payer: Self-pay | Admitting: Family Medicine

## 2021-09-28 ENCOUNTER — Other Ambulatory Visit (HOSPITAL_COMMUNITY): Payer: Self-pay

## 2021-09-28 MED ORDER — SIMVASTATIN 40 MG PO TABS
40.0000 mg | ORAL_TABLET | Freq: Every day | ORAL | 3 refills | Status: DC
  Filled 2021-09-28: qty 90, 90d supply, fill #0

## 2021-11-21 IMAGING — CR DG FINGER MIDDLE 2+V*L*
3 series · 3 of 3 positions shown · non-contrast
Comparison: None.

CLINICAL DATA: Middle finger pain for 2 weeks.

EXAM:
LEFT MIDDLE FINGER 2+V

[x finger pa left]
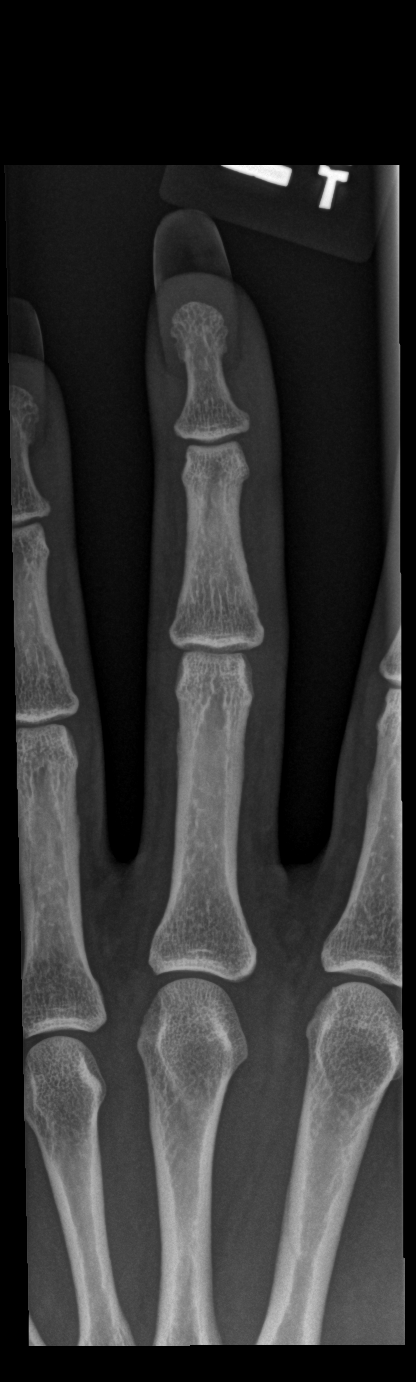

[x finger obl left]
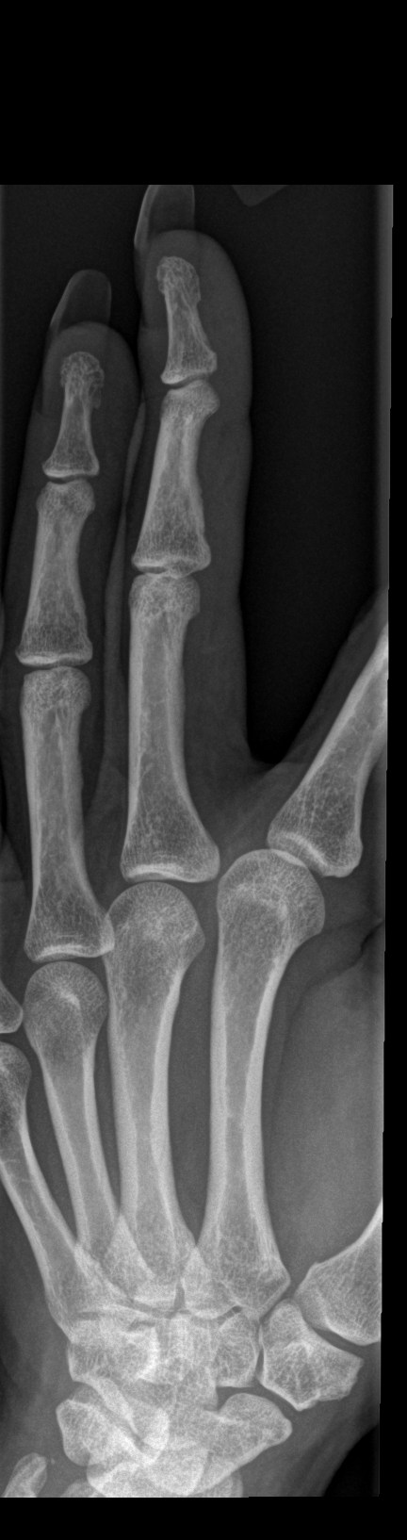

[x finger lat left]
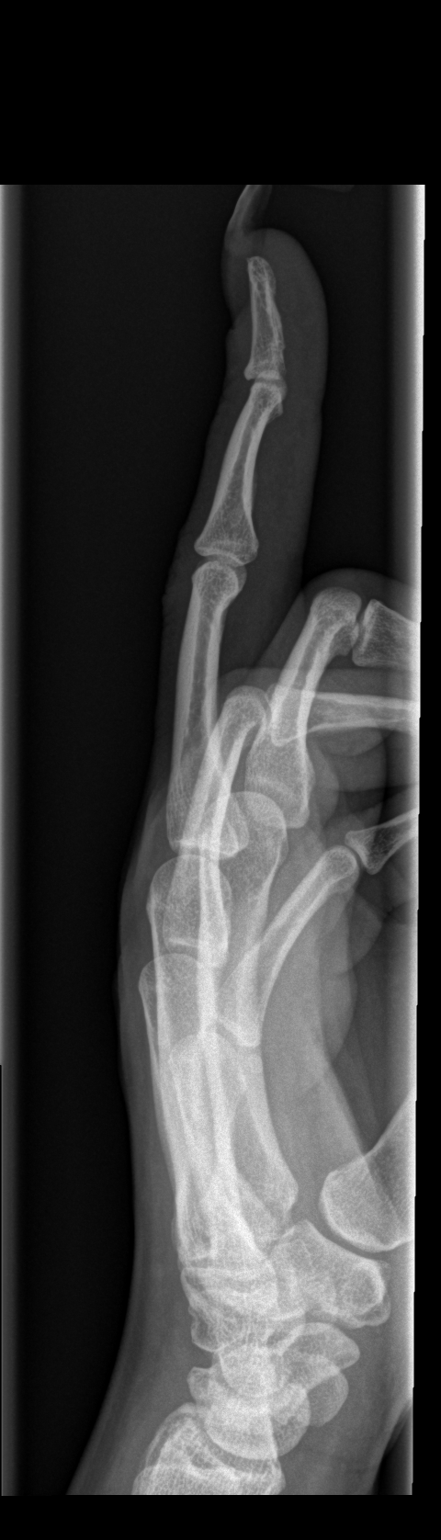

[3 of 3 positions shown; findings below may reference images not displayed]

FINDINGS: There is no evidence of fracture or dislocation. There is no
evidence of arthropathy or other focal bone abnormality. Soft
tissues are unremarkable.
IMPRESSION: Negative.

## 2021-11-21 IMAGING — CR DG SHOULDER 2+V*L*
3 series · 3 of 3 positions shown · non-contrast
Comparison: None.

CLINICAL DATA: Shoulder pain

EXAM:
LEFT SHOULDER - 2+ VIEW

[w shoulder external left]
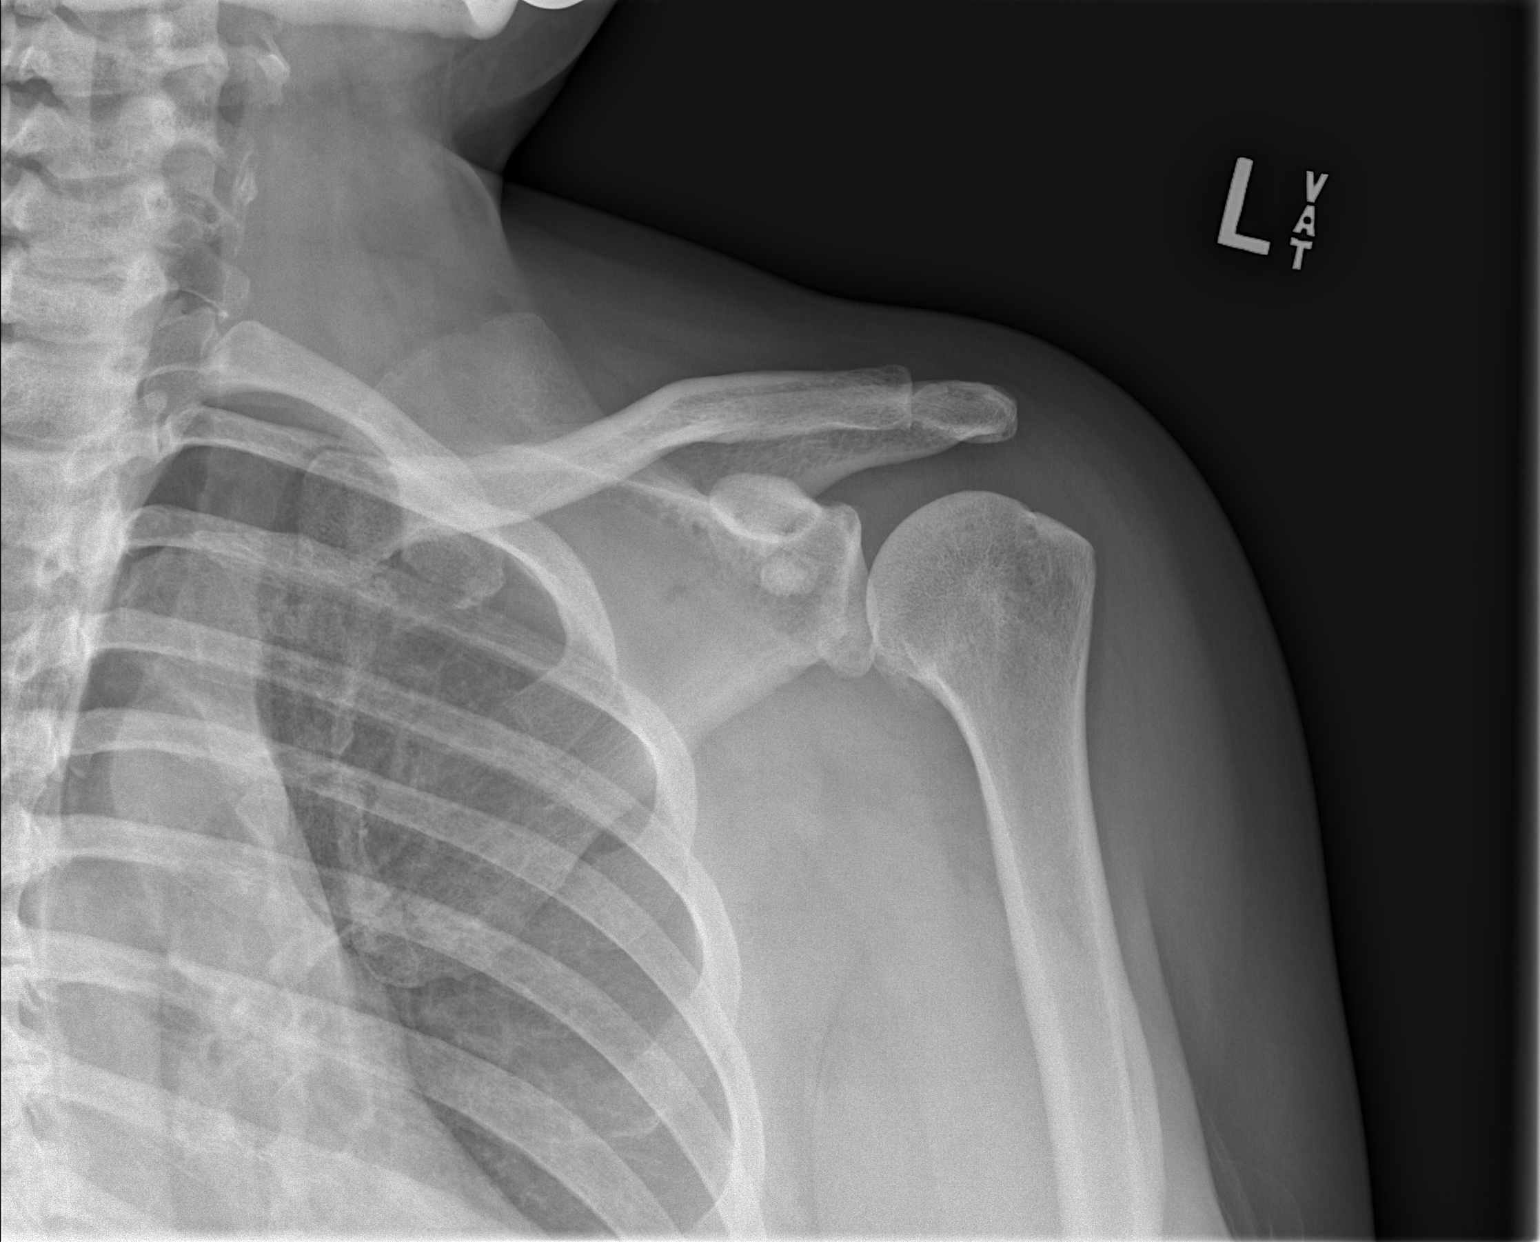

[w shoulder y-view left]
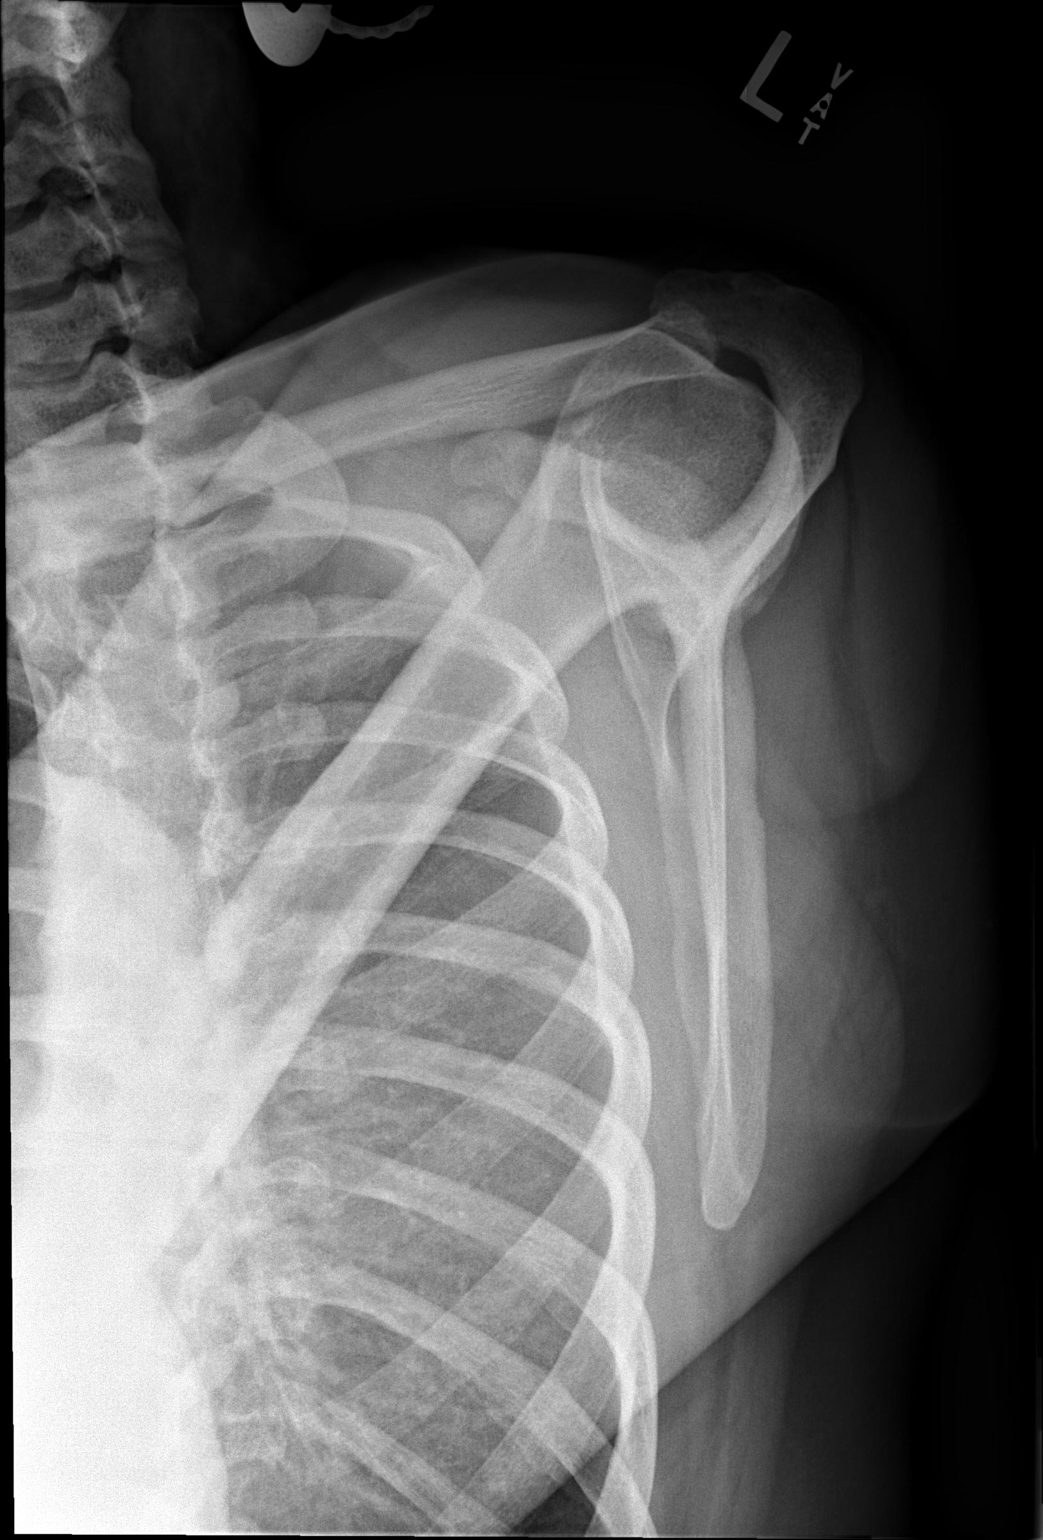

[x shoulder axillary left]
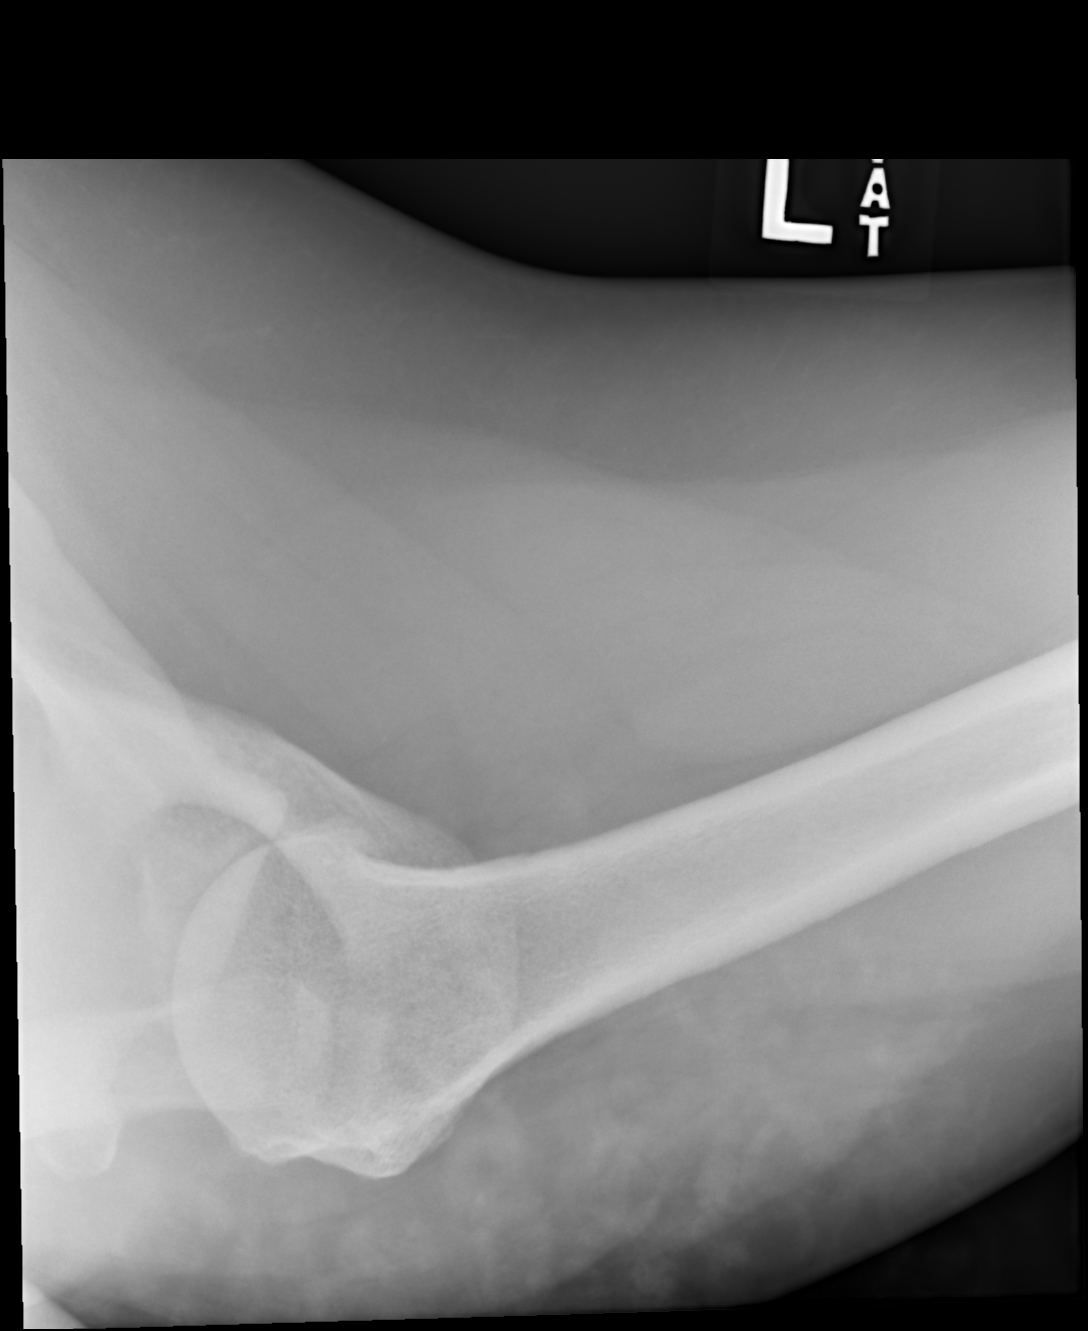

[3 of 3 positions shown; findings below may reference images not displayed]

FINDINGS: There is no evidence of fracture or dislocation. Mild irregularity
along the under surface of the humeral head is likely degenerative
in nature. Soft tissues are unremarkable.
IMPRESSION: Mild degenerative changes without acute findings.

## 2022-01-21 ENCOUNTER — Encounter: Payer: Commercial Managed Care - HMO | Admitting: Student

## 2022-02-06 NOTE — Progress Notes (Deleted)
  SUBJECTIVE:   CHIEF COMPLAINT / HPI:   ***  PERTINENT  PMH / PSH: HTN, prediabetes  OBJECTIVE:  There were no vitals taken for this visit. Physical Exam   ASSESSMENT/PLAN:  There are no diagnoses linked to this encounter. No follow-ups on file. Shelby Mattocks, DO 02/06/2022, 4:42 PM PGY-***, [facility] {    This will disappear when note is signed, click to select method of visit    :1}

## 2022-02-07 ENCOUNTER — Encounter: Payer: Commercial Managed Care - HMO | Admitting: Student

## 2022-11-28 ENCOUNTER — Other Ambulatory Visit (HOSPITAL_COMMUNITY): Payer: Self-pay

## 2023-01-03 NOTE — Progress Notes (Deleted)
  SUBJECTIVE:   CHIEF COMPLAINT / HPI:   Presents today for medication discussion and blood pressure follow-up  PERTINENT  PMH / PSH: HTN, prediabetes, HLD?***  OBJECTIVE:  There were no vitals taken for this visit. Physical Exam   ASSESSMENT/PLAN:   Assessment & Plan  No follow-ups on file. Shelby Mattocks, DO 01/03/2023, 4:11 PM PGY-3, [facility] {    This will disappear when note is signed, click to select method of visit    :1}

## 2023-01-04 ENCOUNTER — Ambulatory Visit: Payer: Commercial Managed Care - HMO | Admitting: Student

## 2023-01-06 ENCOUNTER — Encounter: Payer: Self-pay | Admitting: Student

## 2023-01-06 ENCOUNTER — Ambulatory Visit (INDEPENDENT_AMBULATORY_CARE_PROVIDER_SITE_OTHER): Payer: 59 | Admitting: Student

## 2023-01-06 VITALS — BP 198/100 | HR 79 | Ht 62.0 in | Wt 187.4 lb

## 2023-01-06 DIAGNOSIS — M545 Low back pain, unspecified: Secondary | ICD-10-CM | POA: Insufficient documentation

## 2023-01-06 DIAGNOSIS — Z1159 Encounter for screening for other viral diseases: Secondary | ICD-10-CM

## 2023-01-06 DIAGNOSIS — Z114 Encounter for screening for human immunodeficiency virus [HIV]: Secondary | ICD-10-CM

## 2023-01-06 DIAGNOSIS — R7303 Prediabetes: Secondary | ICD-10-CM

## 2023-01-06 DIAGNOSIS — Z13220 Encounter for screening for lipoid disorders: Secondary | ICD-10-CM | POA: Diagnosis not present

## 2023-01-06 DIAGNOSIS — I159 Secondary hypertension, unspecified: Secondary | ICD-10-CM

## 2023-01-06 LAB — POCT GLYCOSYLATED HEMOGLOBIN (HGB A1C): HbA1c, POC (prediabetic range): 5.7 % (ref 5.7–6.4)

## 2023-01-06 MED ORDER — OLMESARTAN MEDOXOMIL-HCTZ 40-25 MG PO TABS: 1.0000 | ORAL_TABLET | Freq: Every day | ORAL | 0 refills | Status: DC

## 2023-01-06 NOTE — Progress Notes (Unsigned)
  SUBJECTIVE:   CHIEF COMPLAINT / HPI:   Hypertension: BP: (!) 198/100 today. Home medications include: Losartan 50mg  daily, hydrochlorothiazide 25mg  daily. She does not endorse taking these medications as prescribed. Patient has not had a BMP in the past 1 year.   Right lower back pain: Describes right low back/buttock pain.  Points to the SI joint when she states she has pain.  Pain is worse with extension but does give her some pain in flexion as well.  Notes it has been present for approximately 1 month.  PERTINENT  PMH / PSH: HTN, HLD  OBJECTIVE:  BP (!) 198/100   Pulse 79   Ht 5\' 2"  (1.575 m)   Wt 187 lb 6.4 oz (85 kg)   SpO2 98%   BMI 34.28 kg/m  Physical Exam   ASSESSMENT/PLAN:   Assessment & Plan Prediabetes  Secondary hypertension  Encounter for hepatitis C screening test for low risk patient  Screening for human immunodeficiency virus  Screening for hyperlipidemia  Acute right-sided low back pain without sciatica  No follow-ups on file. Shelby Mattocks, DO 01/06/2023, 4:22 PM PGY-3, [facility] {    This will disappear when note is signed, click to select method of visit    :1}

## 2023-01-06 NOTE — Patient Instructions (Signed)
It was great to see you today! Thank you for choosing [facility] for your primary care.  Today we addressed: I have sent in a combo pill for your blood pressure.  If you start to develop chest pain/pressure, headaches, visual difficulties, this may be related to your blood pressure I would recommend proceeding to the ED.  We need to recheck this in 3 to 4 weeks at a clinic visit. I have placed a physical therapy referral regarding your low back pain. At your next visit we will also discuss colonoscopy, mammogram and do a Pap.  If you haven't already, sign up for My Chart to have easy access to your labs results, and communication with your primary care physician. We are checking some labs today. If they are abnormal, I will call you. If they are normal, I will send you a MyChart message (if it is active) or a letter in the mail. If you do not hear about your labs in the next 2 weeks, please call the office. I recommend that you always bring your medications to each appointment as this makes it easy to ensure you are on the correct medications and helps [place] not miss refills when you need them. Return in about 3 weeks (around 01/27/2023) for HTN f/u. Please arrive 15 minutes before your appointment to ensure smooth check in process.  We appreciate your efforts in making this happen.  Thank you for allowing me to participate in your care, Shelby Mattocks, DO 01/06/2023, 4:31 PM PGY-3, [facility]

## 2023-01-07 LAB — BASIC METABOLIC PANEL
BUN/Creatinine Ratio: 16 (ref 9–23)
BUN: 14 mg/dL (ref 6–24)
CO2: 27 mmol/L (ref 20–29)
Calcium: 9.7 mg/dL (ref 8.7–10.2)
Chloride: 102 mmol/L (ref 96–106)
Creatinine, Ser: 0.89 mg/dL (ref 0.57–1.00)
Glucose: 83 mg/dL (ref 70–99)
Potassium: 4 mmol/L (ref 3.5–5.2)
Sodium: 142 mmol/L (ref 134–144)
eGFR: 77 mL/min/{1.73_m2} (ref >59)

## 2023-01-07 LAB — LIPID PANEL
Chol/HDL Ratio: 4 ratio (ref 0.0–4.4)
Cholesterol, Total: 251 mg/dL — ABNORMAL HIGH (ref 100–199)
HDL: 62 mg/dL (ref >39)
LDL Chol Calc (NIH): 163 mg/dL — ABNORMAL HIGH (ref 0–99)
Triglycerides: 146 mg/dL (ref 0–149)
VLDL Cholesterol Cal: 26 mg/dL (ref 5–40)

## 2023-01-07 LAB — HIV ANTIBODY (ROUTINE TESTING W REFLEX): HIV Screen 4th Generation wRfx: NONREACTIVE

## 2023-01-07 LAB — HCV INTERPRETATION

## 2023-01-07 LAB — HCV AB W REFLEX TO QUANT PCR: HCV Ab: NONREACTIVE

## 2023-01-08 NOTE — Assessment & Plan Note (Signed)
Check A1c. 

## 2023-01-08 NOTE — Assessment & Plan Note (Signed)
Acute, non-traumatic. No red flag symptoms. Recommend PT and conservative management. If not beneficial, consider XR.

## 2023-01-08 NOTE — Assessment & Plan Note (Signed)
BP: (!) 198/100 today. Poorly controlled. Goal of <130/80. Continue to work on healthy dietary habits and exercise. Check BMP.  Recheck in 3 weeks. Medication regimen: Restart anti-hypertensives with combo pill olmesartan-hydrochlorothiazide 40-25mg  daily.

## 2023-01-13 ENCOUNTER — Other Ambulatory Visit: Payer: Self-pay | Admitting: Student

## 2023-01-13 ENCOUNTER — Telehealth: Payer: Self-pay | Admitting: Student

## 2023-01-13 DIAGNOSIS — E785 Hyperlipidemia, unspecified: Secondary | ICD-10-CM

## 2023-01-13 MED ORDER — ATORVASTATIN CALCIUM 40 MG PO TABS: 40.0000 mg | ORAL_TABLET | Freq: Every day | ORAL | 3 refills | Status: DC

## 2023-01-13 NOTE — Telephone Encounter (Signed)
Discussed blood work.  Elevated cholesterol, strongly recommend statin which she is amenable to restarting as she used to be on simvastatin.  Rx sent for atorvastatin 40 mg daily and advised to recheck cholesterol in 3 months.

## 2023-01-27 ENCOUNTER — Other Ambulatory Visit: Payer: Self-pay | Admitting: Student

## 2023-01-27 ENCOUNTER — Encounter: Payer: Self-pay | Admitting: Student

## 2023-01-27 ENCOUNTER — Ambulatory Visit (INDEPENDENT_AMBULATORY_CARE_PROVIDER_SITE_OTHER): Payer: 59 | Admitting: Student

## 2023-01-27 VITALS — BP 151/80 | HR 74 | Ht 62.0 in | Wt 188.4 lb

## 2023-01-27 DIAGNOSIS — I10 Essential (primary) hypertension: Secondary | ICD-10-CM

## 2023-01-27 DIAGNOSIS — E669 Obesity, unspecified: Secondary | ICD-10-CM | POA: Diagnosis not present

## 2023-01-27 DIAGNOSIS — Z1231 Encounter for screening mammogram for malignant neoplasm of breast: Secondary | ICD-10-CM

## 2023-01-27 DIAGNOSIS — Z1211 Encounter for screening for malignant neoplasm of colon: Secondary | ICD-10-CM | POA: Diagnosis not present

## 2023-01-27 MED ORDER — VALSARTAN-HYDROCHLOROTHIAZIDE 320-25 MG PO TABS: 1.0000 | ORAL_TABLET | Freq: Every day | ORAL | 3 refills | Status: DC

## 2023-01-27 MED ORDER — WEGOVY 0.25 MG/0.5ML ~~LOC~~ SOAJ: 0.2500 mg | SUBCUTANEOUS | 1 refills | Status: DC

## 2023-01-27 MED ORDER — AMLODIPINE BESYLATE 5 MG PO TABS: 5.0000 mg | ORAL_TABLET | Freq: Every day | ORAL | 0 refills | Status: DC

## 2023-01-27 NOTE — Progress Notes (Signed)
  SUBJECTIVE:   CHIEF COMPLAINT / HPI:   Hypertension: Home medications include: Olmesartan-HCTZ 40-25 mg daily. She endorses taking these medications as prescribed. Does check blood pressure at home. Patient has had a BMP in the past 1 year.  Weight loss: Attempting to lose weight to assist with her hypertension and hyperlipidemia.  Interested in weight loss assistance.  She has difficulty with increased appetite.  She walks during her job but does not engage with exercise much other than that.  She denies family history of thyroid cancer and personal history of pancreatitis.  PERTINENT  PMH / PSH: HTN, HLD  OBJECTIVE:  BP (!) 151/80   Pulse 74   Ht 5\' 2"  (1.575 m)   Wt 188 lb 6.4 oz (85.5 kg)   LMP 01/27/2023   SpO2 100%   BMI 34.46 kg/m  General: Well-appearing, NAD CV: RRR, no murmurs auscultated  ASSESSMENT/PLAN:   Assessment & Plan Primary hypertension BP: (!) 151/80 today. Poorly controlled. Goal of <130/80. Continue to work on healthy dietary habits and exercise. Follow up in 4 weeks. Medication regimen: Changed combo pill to valsartan/HCTZ 320-25 mg daily as this would cause less per her insurance, initiate amlodipine 5 mg daily. Obesity (BMI 30-39.9) Offered referral to the weight and wellness, patient preferred to try [facility] through my care first.  Reviewed contraindications and potential side effects.  Recommend 150 minutes moderate intensity exercise weekly, reviewed what exactly this means.  Start Wegovy 0.25 mg weekly. Encounter for screening mammogram for malignant neoplasm of breast Bilateral screening mammogram. Screen for colon cancer Referral to GI. Return in about 4 weeks (around 02/24/2023) for HTN, obesity follow-up. Shelby Mattocks, DO 01/27/2023, 9:05 AM PGY-3,  Family Medicine

## 2023-01-27 NOTE — Assessment & Plan Note (Signed)
Offered referral to the weight and wellness, patient preferred to try [facility] through my care first.  Reviewed contraindications and potential side effects.  Recommend 150 minutes moderate intensity exercise weekly, reviewed what exactly this means.  Start Wegovy 0.25 mg weekly.

## 2023-01-27 NOTE — Patient Instructions (Addendum)
It was great to see you today! Thank you for choosing [facility] for your primary care.  Today we addressed: Hypertension: Your blood pressure goal is <130/80.  You are still elevated however improved at today's visit.  I have changed her combo pill to valsartan-HCTZ as that should cost the less money.  We have started amlodipine 5 mg daily to help get you closer to your goal. Obesity/weight loss: We are starting you on Wegovy. I have placed an order for mammogram and referral to GI for colonoscopy.  If you haven't already, sign up for My Chart to have easy access to your labs results, and communication with your primary care physician.  Return in about 4 weeks (around 02/24/2023) for HTN, obesity follow-up. Please arrive 15 minutes before your appointment to ensure smooth check in process.  We appreciate your efforts in making this happen.  Thank you for allowing me to participate in your care, Shelby Mattocks, DO 01/27/2023, 8:58 AM PGY-3, [facility]

## 2023-01-27 NOTE — Assessment & Plan Note (Addendum)
BP: (!) 151/80 today. Poorly controlled. Goal of <130/80. Continue to work on healthy dietary habits and exercise. Follow up in 4 weeks. Medication regimen: Changed combo pill to valsartan/HCTZ 320-25 mg daily as this would cause less per her insurance, initiate amlodipine 5 mg daily.

## 2023-03-02 ENCOUNTER — Ambulatory Visit (INDEPENDENT_AMBULATORY_CARE_PROVIDER_SITE_OTHER): Payer: 59 | Admitting: Student

## 2023-03-02 ENCOUNTER — Encounter: Payer: Self-pay | Admitting: Student

## 2023-03-02 VITALS — BP 136/67 | Ht 62.0 in | Wt 186.8 lb

## 2023-03-02 DIAGNOSIS — E785 Hyperlipidemia, unspecified: Secondary | ICD-10-CM

## 2023-03-02 DIAGNOSIS — Z0000 Encounter for general adult medical examination without abnormal findings: Secondary | ICD-10-CM | POA: Diagnosis not present

## 2023-03-02 DIAGNOSIS — I10 Essential (primary) hypertension: Secondary | ICD-10-CM | POA: Diagnosis not present

## 2023-03-02 MED ORDER — VALSARTAN-HYDROCHLOROTHIAZIDE 320-25 MG PO TABS: 1.0000 | ORAL_TABLET | Freq: Every day | ORAL | 3 refills | Status: DC

## 2023-03-02 MED ORDER — AMLODIPINE BESYLATE 5 MG PO TABS: 5.0000 mg | ORAL_TABLET | Freq: Every day | ORAL | 3 refills | Status: DC

## 2023-03-02 MED ORDER — ATORVASTATIN CALCIUM 40 MG PO TABS: 40.0000 mg | ORAL_TABLET | Freq: Every day | ORAL | 0 refills | Status: DC

## 2023-03-02 NOTE — Assessment & Plan Note (Signed)
Refilled atorvastatin today.  Recheck LDL early February.

## 2023-03-02 NOTE — Assessment & Plan Note (Addendum)
BP: 136/67 today. Well controlled. Goal of <130/80 although given diastolic 67, keep BP regimen unchanged. Continue to work on healthy dietary habits and exercise. Medication regimen: Valsartan/HCTZ 320-25 mg daily, amlodipine 5 mg daily.  Refilled medications today.

## 2023-03-02 NOTE — Progress Notes (Signed)
  SUBJECTIVE:   CHIEF COMPLAINT / HPI:   Hypertension: Home medications include: Valsartan/HCTZ 320-25 mg daily, amlodipine 5 mg daily. She endorses taking these medications as prescribed. Patient has had a BMP in the past 1 year.  She is requesting refill of her blood pressure and cholesterol medications.  Unfortunately, pharmacy does not have Wegovy that was prescribed for her.  We discussed that she may call around to pharmacies to identify stock and let me know where to send prescription in the future.  Healthcare maintenance: Referral is in place for colonoscopy, she has information for scheduling mammogram.  She is due for Pap smear.  PERTINENT  PMH / PSH: HTN, HLD  OBJECTIVE:  BP 136/67   Ht 5\' 2"  (1.575 m)   Wt 186 lb 12.8 oz (84.7 kg)   BMI 34.17 kg/m  General: Well-appearing, NAD  ASSESSMENT/PLAN:   Assessment & Plan Primary hypertension BP: 136/67 today. Well controlled. Goal of <130/80 although given diastolic 67, keep BP regimen unchanged. Continue to work on healthy dietary habits and exercise. Medication regimen: Valsartan/HCTZ 320-25 mg daily, amlodipine 5 mg daily.  Refilled medications today. Hyperlipidemia, unspecified hyperlipidemia type Refilled atorvastatin today.  Recheck LDL early February. Healthcare maintenance Provided information for contacting GI for colonoscopy.  She is planning on scheduling mammogram soon.  Will proceed with Pap smear at next visit. Return in about 6 weeks (around 04/10/2023) for cholesterol f/u and Pap smear. Shelby Mattocks, DO 03/02/2023, 11:28 AM PGY-3, [facility]

## 2023-03-02 NOTE — Patient Instructions (Addendum)
It was great to see you today! Thank you for choosing [facility] for your primary care.  Today we addressed: Please scheduling your mammogram.  Below is the information for your GI referral.  They do not call you in a week, please call them to schedule for your colonoscopy. Blood pressure looks great today.  No changes.  I have refilled your blood pressure medications and your cholesterol medication.  Focus on increased exercise and diet control. Please return in early February for Pap smear and recheck of your cholesterol levels.   [facility] [address] [place] [phone]  If you haven't already, sign up for My Chart to have easy access to your labs results, and communication with your primary care physician.  Return in about 6 weeks (around 04/10/2023) for cholesterol f/u and Pap smear. Please arrive 15 minutes before your appointment to ensure smooth check in process.  We appreciate your efforts in making this happen.  Thank you for allowing me to participate in your care, Shelby Mattocks, DO 03/02/2023, 11:28 AM PGY-3, [facility]

## 2023-03-10 ENCOUNTER — Other Ambulatory Visit: Payer: Self-pay | Admitting: Student

## 2023-03-10 DIAGNOSIS — E669 Obesity, unspecified: Secondary | ICD-10-CM

## 2023-04-08 NOTE — Progress Notes (Unsigned)
  SUBJECTIVE:   CHIEF COMPLAINT / HPI:   Hyperlipidemia: Home medications include atorvastatin 40 mg daily. Endorses compliance. Last lipid panel:  Lab Results  Component Value Date   CHOL 251 (H) 01/06/2023   HDL 62 01/06/2023   LDLCALC 163 (H) 01/06/2023   TRIG 146 01/06/2023   CHOLHDL 4.0 01/06/2023   Cough: States she was sick approximately 1 week ago and is holding onto her cough longer than anticipated.  She received flu vaccination and tested positive for flu at that time.  She stopped smoking approximately 1 month ago but states she only smoked for a few months half pack a day.  PERTINENT  PMH / PSH: HTN, HLD  OBJECTIVE:  BP 130/78   Pulse 69   Ht 5\' 2"  (1.575 m)   Wt 181 lb 9.6 oz (82.4 kg)   SpO2 96%   BMI 33.22 kg/m  General: Well-appearing, NAD CV: RRR, murmurs auscultated Pulm: Diminished breath sounds with diffuse expiratory wheezing, normal WOB  ASSESSMENT/PLAN:   Assessment & Plan Hyperlipidemia, unspecified hyperlipidemia type  Screening for cervical cancer  No follow-ups on file. Shelby Mattocks, DO 04/10/2023, 5:06 PM PGY-3, [facility] {    This will disappear when note is signed, click to select method of visit    :1}

## 2023-04-10 ENCOUNTER — Other Ambulatory Visit (HOSPITAL_COMMUNITY)
Admission: RE | Admit: 2023-04-10 | Discharge: 2023-04-10 | Disposition: A | Discharge status: Home / Self Care | Payer: 59 | Source: Ambulatory Visit | Attending: Family Medicine | Admitting: Family Medicine

## 2023-04-10 ENCOUNTER — Ambulatory Visit (INDEPENDENT_AMBULATORY_CARE_PROVIDER_SITE_OTHER): Payer: 59 | Admitting: Student

## 2023-04-10 VITALS — BP 130/78 | HR 69 | Ht 62.0 in | Wt 181.6 lb

## 2023-04-10 DIAGNOSIS — Z124 Encounter for screening for malignant neoplasm of cervix: Secondary | ICD-10-CM | POA: Diagnosis not present

## 2023-04-10 DIAGNOSIS — R0602 Shortness of breath: Secondary | ICD-10-CM

## 2023-04-10 DIAGNOSIS — E785 Hyperlipidemia, unspecified: Secondary | ICD-10-CM | POA: Diagnosis not present

## 2023-04-10 MED ORDER — ALBUTEROL SULFATE HFA 108 (90 BASE) MCG/ACT IN AERS
2.0000 | INHALATION_SPRAY | RESPIRATORY_TRACT | 0 refills | Status: DC | PRN
Start: 2023-04-10 — End: 2023-05-02

## 2023-04-10 MED ORDER — IPRATROPIUM BROMIDE 0.02 % IN SOLN
0.5000 mg | Freq: Once | RESPIRATORY_TRACT | Status: AC
  Administered 2023-04-10: 0.5 mg via RESPIRATORY_TRACT

## 2023-04-10 MED ORDER — ALBUTEROL SULFATE (2.5 MG/3ML) 0.083% IN NEBU
2.5000 mg | INHALATION_SOLUTION | Freq: Once | RESPIRATORY_TRACT | Status: AC
  Administered 2023-04-10: 2.5 mg via RESPIRATORY_TRACT

## 2023-04-10 NOTE — Patient Instructions (Addendum)
It was great to see you today! Thank you for choosing [facility] for your primary care.  Today we addressed: We performed a Pap smear today. Please call to set up your mammogram.  I have also attached the information below for GI so that you may call them for a colonoscopy. For your shortness of breath, I would like you to get a chest x-ray.  We gave you a DuoNeb here and I have prescribed you an albuterol inhaler to take at home.  You may also try Robitussin to help cough up mucus.  I have placed an order for chest xray.  Please go to [facility] [employer] [facility] to have this completed. You do not need an appointment, but if you would like to call them beforehand, their number is [phone].  We will contact you with your results afterwards.   [facility] [address] [place] [phone]  If you haven't already, sign up for My Chart to have easy access to your labs results, and communication with your primary care physician. We are checking some labs today. If they are abnormal, I will call you. If they are normal, I will send you a MyChart message (if it is active) or a letter in the mail. If you do not hear about your labs in the next 2 weeks, please call the office. Call the clinic at [phone] if your symptoms worsen or you have any concerns. Return if symptoms worsen or fail to improve. Please arrive 15 minutes before your appointment to ensure smooth check in process.  We appreciate your efforts in making this happen.  Thank you for allowing me to participate in your care, Shelby Mattocks, DO 04/10/2023, 5:16 PM PGY-3, [facility]

## 2023-04-11 ENCOUNTER — Encounter: Payer: Self-pay | Admitting: Student

## 2023-04-11 LAB — LIPID PANEL
Chol/HDL Ratio: 3.4 {ratio} (ref 0.0–4.4)
Cholesterol, Total: 119 mg/dL (ref 100–199)
HDL: 35 mg/dL — ABNORMAL LOW (ref >39)
LDL Chol Calc (NIH): 66 mg/dL (ref 0–99)
Triglycerides: 96 mg/dL (ref 0–149)
VLDL Cholesterol Cal: 18 mg/dL (ref 5–40)

## 2023-04-11 NOTE — Assessment & Plan Note (Signed)
Compliant with atorvastatin 40 mg daily.  Recheck lipid panel.

## 2023-04-12 LAB — CYTOLOGY - PAP
Adequacy: ABSENT
Comment: NEGATIVE
Diagnosis: NEGATIVE
High risk HPV: NEGATIVE

## 2023-05-02 ENCOUNTER — Other Ambulatory Visit: Payer: Self-pay | Admitting: Student

## 2023-05-02 DIAGNOSIS — R0602 Shortness of breath: Secondary | ICD-10-CM

## 2023-07-15 ENCOUNTER — Other Ambulatory Visit: Payer: Self-pay | Admitting: Student

## 2023-07-15 DIAGNOSIS — E785 Hyperlipidemia, unspecified: Secondary | ICD-10-CM

## 2023-08-15 ENCOUNTER — Encounter: Payer: Self-pay | Admitting: *Deleted

## 2023-08-31 ENCOUNTER — Other Ambulatory Visit (HOSPITAL_COMMUNITY): Payer: Self-pay

## 2024-03-04 ENCOUNTER — Other Ambulatory Visit: Payer: Self-pay

## 2024-03-04 DIAGNOSIS — I10 Essential (primary) hypertension: Secondary | ICD-10-CM

## 2024-03-04 MED ORDER — AMLODIPINE BESYLATE 5 MG PO TABS: 5.0000 mg | ORAL_TABLET | Freq: Every day | ORAL | 3 refills | Status: AC

## 2024-03-04 MED ORDER — VALSARTAN-HYDROCHLOROTHIAZIDE 320-25 MG PO TABS: 1.0000 | ORAL_TABLET | Freq: Every day | ORAL | 3 refills | Status: AC
